# Patient Record
Sex: Male | Born: 1975 | Race: White | Hispanic: No | Smoking: Never smoker
Health system: Southern US, Community
[De-identification: ages and names within clinical notes are randomized; demographics above are authoritative.]

## PROBLEM LIST (undated history)

## (undated) DIAGNOSIS — J309 Allergic rhinitis, unspecified: Secondary | ICD-10-CM

## (undated) DIAGNOSIS — K219 Gastro-esophageal reflux disease without esophagitis: Secondary | ICD-10-CM

## (undated) DIAGNOSIS — I10 Essential (primary) hypertension: Secondary | ICD-10-CM

## (undated) HISTORY — DX: Gastro-esophageal reflux disease without esophagitis: K21.9

## (undated) HISTORY — DX: Allergic rhinitis, unspecified: J30.9

## (undated) HISTORY — DX: Essential (primary) hypertension: I10

---

## 2003-08-04 DIAGNOSIS — R002 Palpitations: Secondary | ICD-10-CM | POA: Insufficient documentation

## 2004-07-28 HISTORY — PX: TOE SURGERY: SHX1073

## 2004-12-04 DIAGNOSIS — R7402 Elevation of levels of lactic acid dehydrogenase (LDH): Secondary | ICD-10-CM | POA: Insufficient documentation

## 2005-09-30 DIAGNOSIS — F419 Anxiety disorder, unspecified: Secondary | ICD-10-CM | POA: Insufficient documentation

## 2009-07-15 ENCOUNTER — Ambulatory Visit (HOSPITAL_COMMUNITY): Admission: EM | Admit: 2009-07-15 | Discharge: 2009-07-15 | Payer: Self-pay | Admitting: Emergency Medicine

## 2010-08-03 IMAGING — CR DG TOE GREAT 2+V*R*
3 series · 3 of 3 positions shown · non-contrast
Comparison: This same date.

CLINICAL DATA: Dislocation at the IP joint.  Post reduction view.

RIGHT TOE - 2+ VIEW

[t toes ap right]
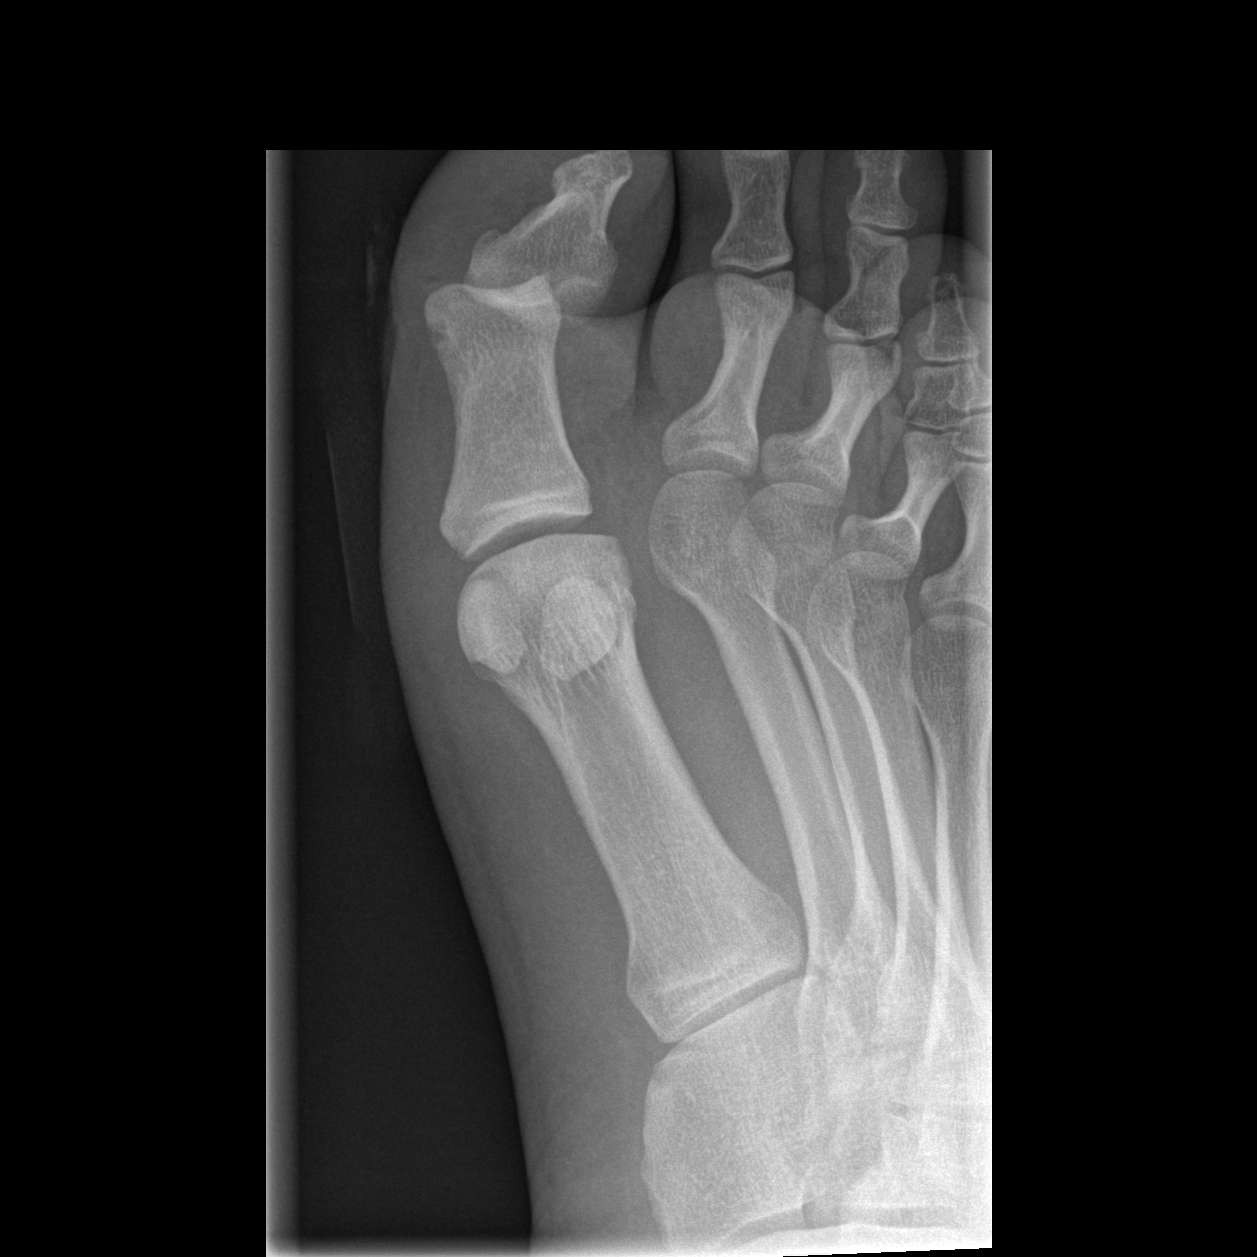

[t toes oblique right]
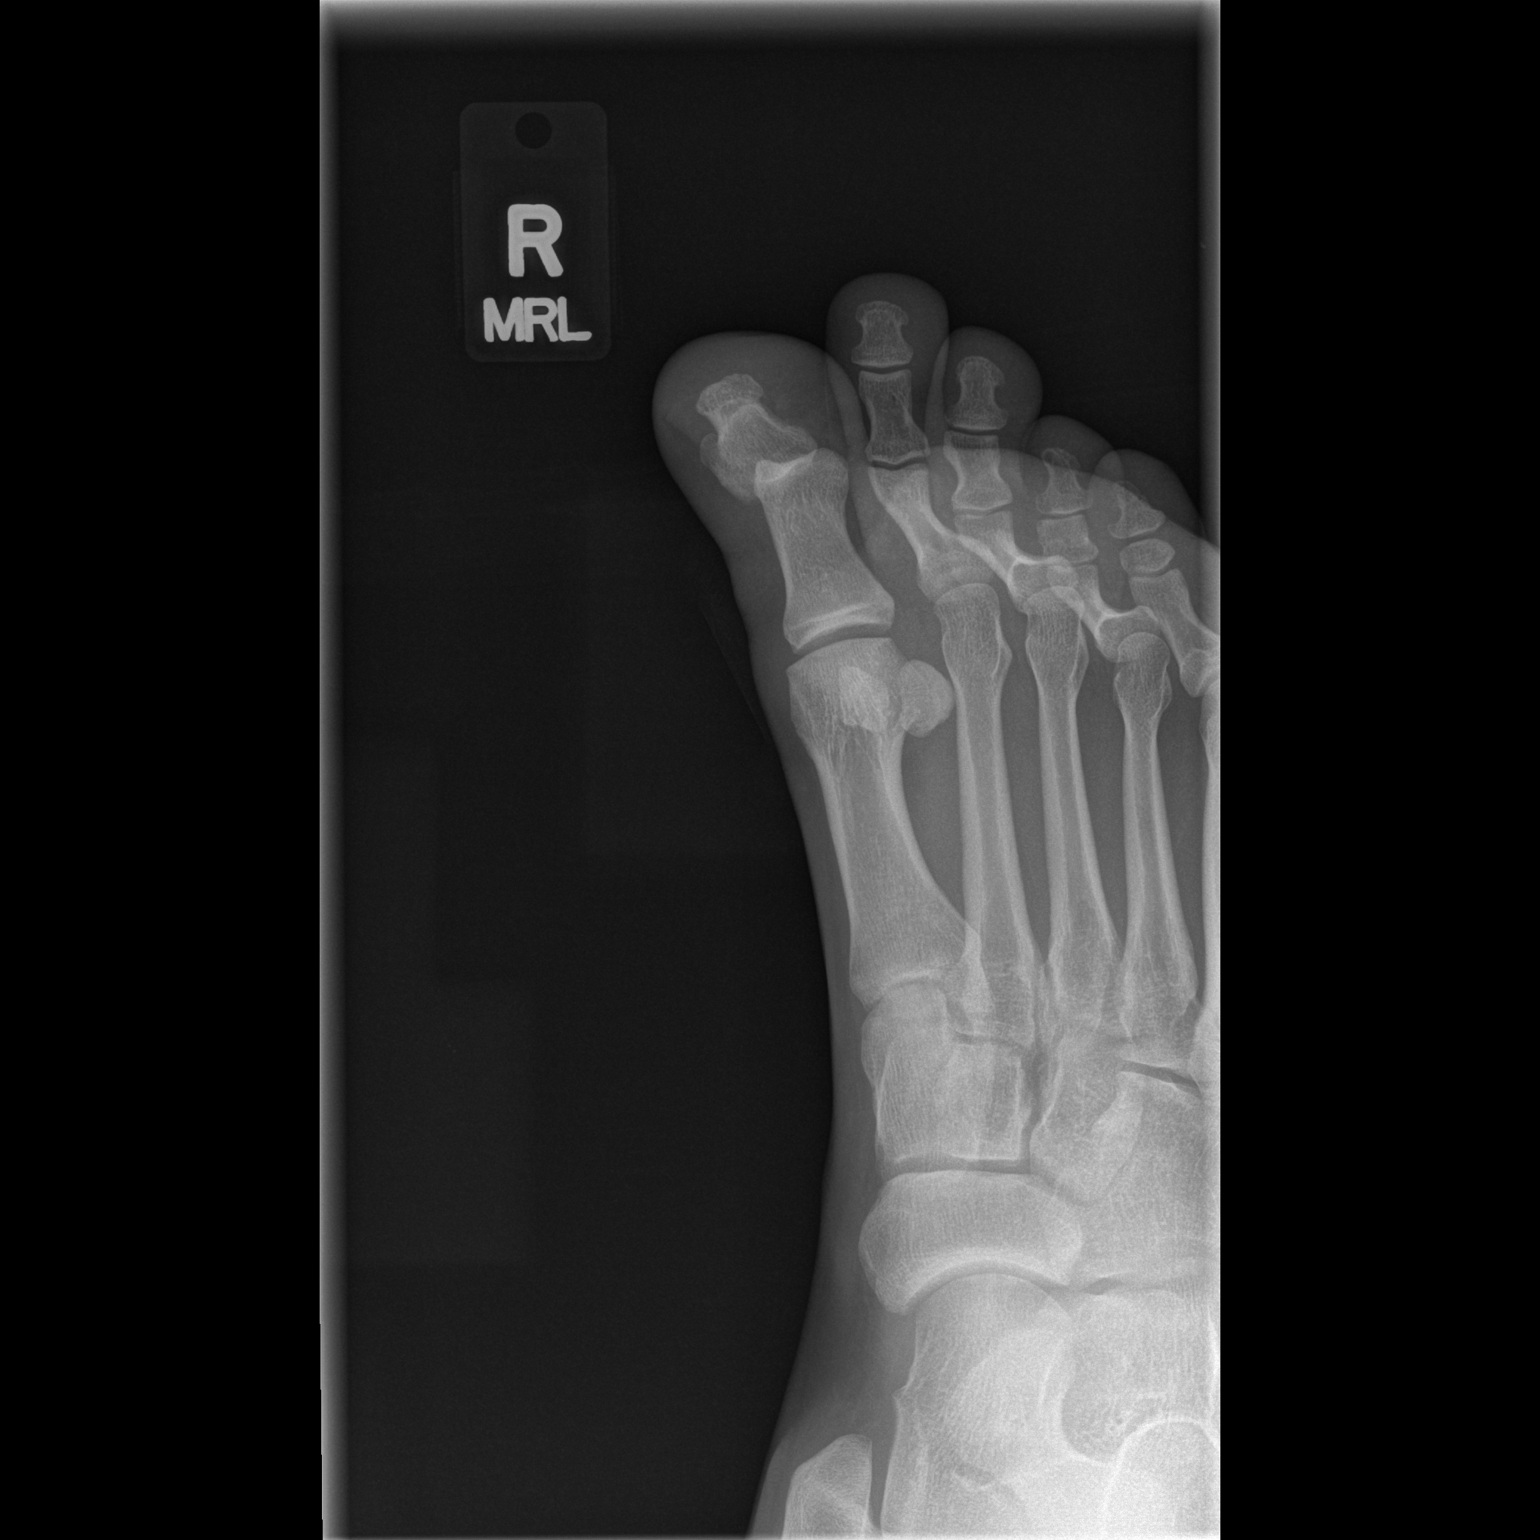

[t toes lateral right]
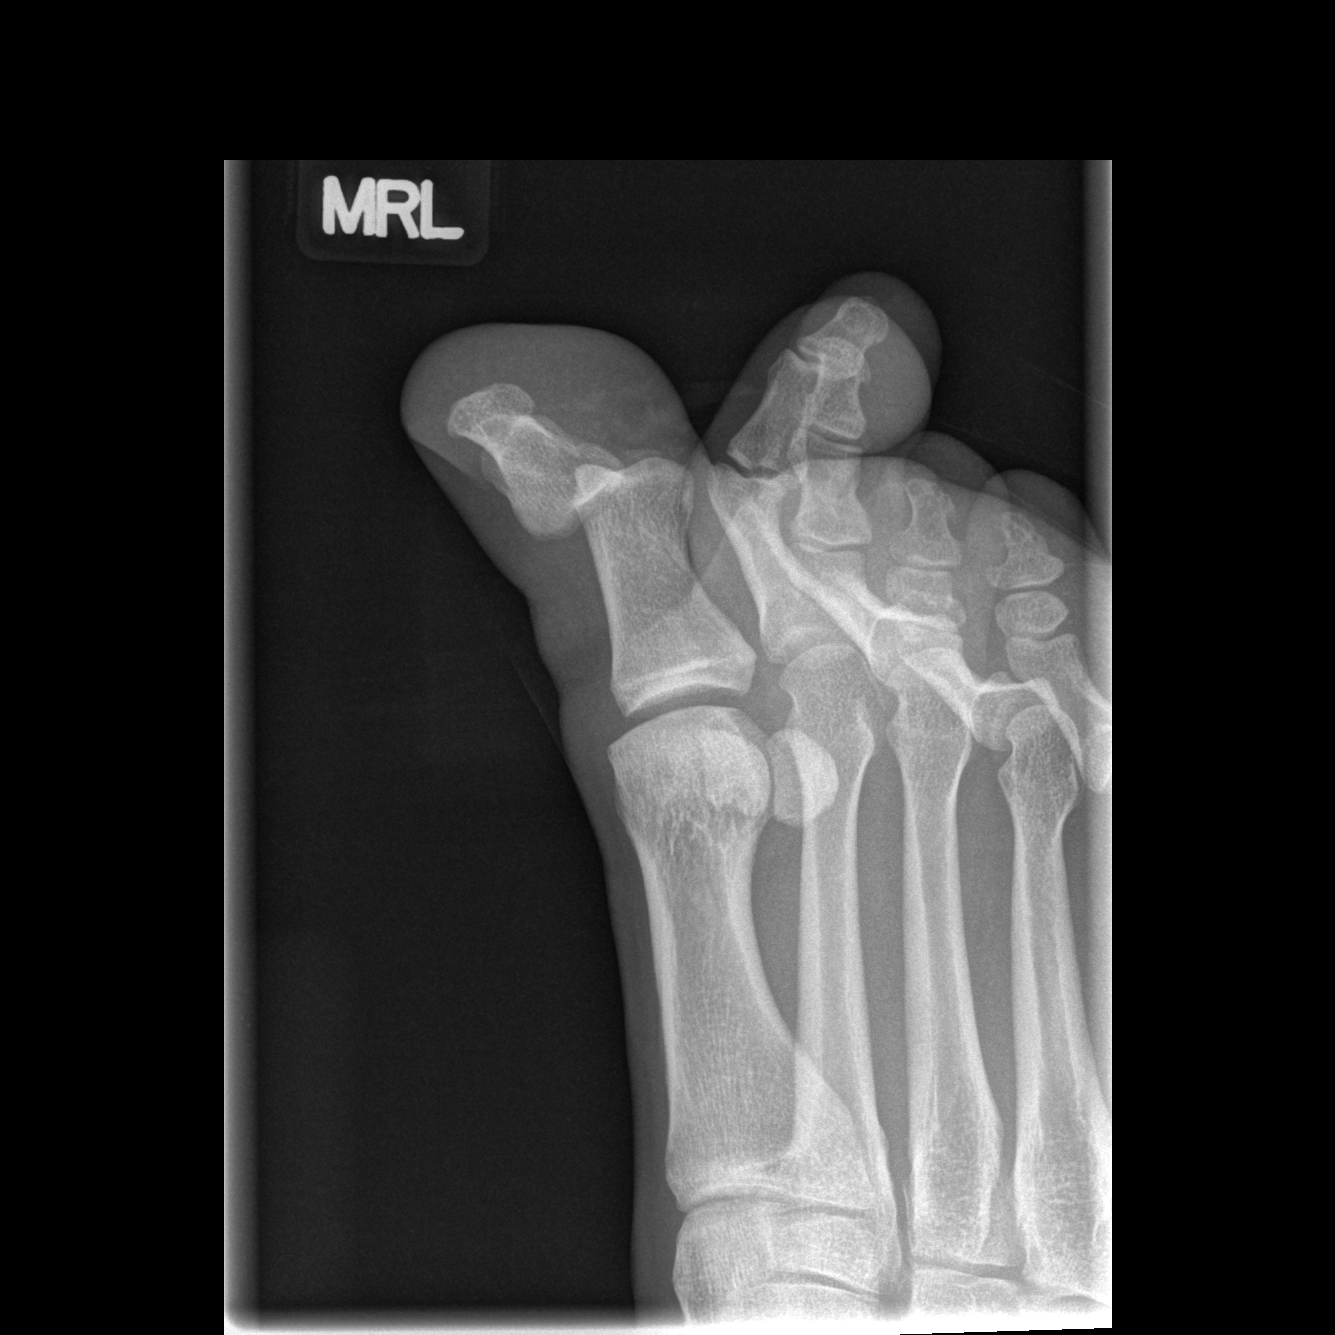

[3 of 3 positions shown; findings below may reference images not displayed]

FINDINGS: The dislocation has not been reduced.  On one view there
is suggestion of a possible  avulsed fragment of bone but this is
not definitive.
IMPRESSION: No change in the dislocation at the IP joint of great toe.
Possible avulsion fracture.

## 2017-11-06 ENCOUNTER — Ambulatory Visit: Payer: Self-pay | Admitting: Family Medicine

## 2017-11-20 ENCOUNTER — Ambulatory Visit: Payer: Self-pay | Admitting: Family Medicine

## 2017-12-10 LAB — HEPATIC FUNCTION PANEL
ALT: 20 (ref 10–40)
AST: 18 (ref 14–40)
Alkaline Phosphatase: 96 (ref 25–125)
BILIRUBIN, TOTAL: 0.3

## 2017-12-10 LAB — IRON,TIBC AND FERRITIN PANEL: Iron: 66

## 2017-12-10 LAB — BASIC METABOLIC PANEL
BUN: 16 (ref 4–21)
Creatinine: 0.9 (ref <=1.3)
GLUCOSE: 104
POTASSIUM: 5.2 (ref 3.4–5.3)
SODIUM: 140 (ref 137–147)

## 2017-12-10 LAB — LIPID PANEL
Cholesterol: 203 — AB (ref 0–200)
HDL: 44 (ref 35–70)
LDL Cholesterol: 117
Triglycerides: 211 — AB (ref 40–160)

## 2017-12-10 LAB — CBC AND DIFFERENTIAL
HEMATOCRIT: 48 (ref 41–53)
HEMOGLOBIN: 15.6 (ref 13.5–17.5)
Platelets: 345 (ref 150–399)
WBC: 7.6

## 2017-12-10 LAB — PSA: PSA: 0.7

## 2017-12-28 ENCOUNTER — Encounter: Payer: Self-pay | Admitting: Family Medicine

## 2017-12-28 ENCOUNTER — Ambulatory Visit (INDEPENDENT_AMBULATORY_CARE_PROVIDER_SITE_OTHER): Payer: PRIVATE HEALTH INSURANCE | Admitting: Family Medicine

## 2017-12-28 VITALS — BP 138/84 | HR 56 | Temp 98.3°F | Resp 16 | Ht 70.0 in | Wt 189.5 lb

## 2017-12-28 DIAGNOSIS — R7301 Impaired fasting glucose: Secondary | ICD-10-CM

## 2017-12-28 DIAGNOSIS — I10 Essential (primary) hypertension: Secondary | ICD-10-CM

## 2017-12-28 NOTE — Progress Notes (Signed)
Office Note 12/28/2017  CC:  Chief Complaint  Patient presents with  . Establish Care  . Follow-up    RCI, pt is fasting.     HPI:  Mark Montgomery is a 42 y.o. male who is here to establish care Patient's most recent primary MD: Dr. Truddie [facility]rum[facility]Stambaugh at [facility] in [place], [place]. Old records were reviewed prior to or during today's visit (BMET and FLP from 10/2017).  BMET/FLP from 10/2017 all normal except glucose 107-->the time of his most recent CPE.  HTN: home monitoring shows 130-140/60-65.   No recent changes in his med mgmt.  He recently had blood work through his employer but has no results yet. No known prior fasting glucose elevations, no known Hba1c testing. Exercise: active but no formal exercise.   Past Medical History:  Diagnosis Date  . Allergic rhinitis   . Hypertension     Past Surgical History:  Procedure Laterality Date  . TOE SURGERY Right 2006   Great Toe    Family History  Problem Relation Age of Onset  . Hearing loss Mother   . Heart disease Mother   . Hypertension Mother   . Heart attack Father   . Heart disease Sister   . Heart disease Paternal Grandmother   . Hypertension Paternal Grandmother     Social History   Socioeconomic History  . Marital status: Divorced    Spouse name: Not on file  . Number of children: Not on file  . Years of education: Not on file  . Highest education level: Not on file  Occupational History  . Not on file  Social Needs  . Financial resource strain: Not on file  . Food insecurity:    Worry: Not on file    Inability: Not on file  . Transportation needs:    Medical: Not on file    Non-medical: Not on file  Tobacco Use  . Smoking status: Passive Smoke Exposure - Never Smoker  . Smokeless tobacco: Never Used  Substance and Sexual Activity  . Alcohol use: Yes    Comment: occasionally  . Drug use: Never  . Sexual activity: Not on file  Lifestyle  . Physical activity:    Days per week: Not on  file    Minutes per session: Not on file  . Stress: Not on file  Relationships  . Social connections:    Talks on phone: Not on file    Gets together: Not on file    Attends religious service: Not on file    Active member of club or organization: Not on file    Attends meetings of clubs or organizations: Not on file    Relationship status: Not on file  . Intimate partner violence:    Fear of current or ex partner: Not on file    Emotionally abused: Not on file    Physically abused: Not on file    Forced sexual activity: Not on file  Other Topics Concern  . Not on file  Social History Narrative   Divorced,no children.   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   No tobacco.   Alc: occ/social.    Outpatient Encounter Medications as of 12/28/2017  Medication Sig  . atenolol (TENORMIN) 100 MG tablet Take 1 tablet by mouth daily.  . fluticasone (FLONASE) 50 MCG/ACT nasal spray Place 1 spray into both nostrils daily as needed.  Marland Kitchen. ibuprofen (ADVIL,MOTRIN) 400 MG tablet Take 1 tablet by mouth  2 (two) times daily as needed.  . loratadine (CLARITIN) 10 MG tablet Take 1 tablet by mouth daily as needed.  . montelukast (SINGULAIR) 10 MG tablet Take 1 tablet by mouth daily as needed.   No facility-administered encounter medications on file as of 12/28/2017.     No Known Allergies  ROS Review of Systems  Constitutional: Negative for fatigue and fever.  HENT: Negative for congestion and sore throat.   Eyes: Negative for visual disturbance.  Respiratory: Negative for cough.   Cardiovascular: Negative for chest pain.  Gastrointestinal: Negative for abdominal pain and nausea.  Genitourinary: Negative for dysuria.  Musculoskeletal: Positive for arthralgias (low back and knees achy alot--physical labor). Negative for back pain and joint swelling.  Skin: Negative for rash.  Neurological: Negative for weakness and headaches.  Hematological: Negative  for adenopathy.    PE; Initial bp today 151/76.  Manual bp recheck: 138/84 Blood pressure 138/84, pulse (!) 56, temperature 98.3 F (36.8 C), temperature source Oral, resp. rate 16, height 5\' 10"  (1.778 m), weight 189 lb 8 oz (86 kg), SpO2 100 %. Body mass index is 27.19 kg/m.  Gen: Alert, well appearing.  Patient is oriented to person, place, time, and situation. AFFECT: pleasant, lucid thought and speech. GNF:AOZH: no injection, icteris, swelling, or exudate.  EOMI, PERRLA. Mouth: lips without lesion/swelling.  Oral mucosa pink and moist. Oropharynx without erythema, exudate, or swelling.  CV: RRR, no m/r/g.   LUNGS: CTA bilat, nonlabored resps, good aeration in all lung fields. ABD: soft, NT EXT: no clubbing, cyanosis, or edema.   Pertinent labs:  None today (see HPI above)  ASSESSMENT AND PLAN:   New pt: obtain old PCP records.  1) HTN: Borderline home measurements as well as here today (systolics). He is not in favor of any med change/addition today, wants to focus on better diet and exercise. He is open to change in med mgmt if this is not effective.  2) IFG: very mild gluc elevation last labs 10/2017. He will drop off results from recent labs drawn via his employer when he gets his results. Plan on A1c check with his [facility] labs at next CPE--due 10/2018.  An After Visit Summary was printed and given to the patient.  Return for CPE 10/2018.  Signed:  Santiago Bumpers, MD           12/28/2017

## 2018-01-21 ENCOUNTER — Encounter: Payer: Self-pay | Admitting: Family Medicine

## 2018-02-15 ENCOUNTER — Encounter

## 2018-02-15 ENCOUNTER — Ambulatory Visit (INDEPENDENT_AMBULATORY_CARE_PROVIDER_SITE_OTHER): Payer: PRIVATE HEALTH INSURANCE | Admitting: Family Medicine

## 2018-02-15 ENCOUNTER — Encounter: Payer: Self-pay | Admitting: Family Medicine

## 2018-02-15 VITALS — BP 167/72 | HR 47 | Temp 98.3°F | Resp 16 | Ht 70.0 in | Wt 189.1 lb

## 2018-02-15 DIAGNOSIS — J029 Acute pharyngitis, unspecified: Secondary | ICD-10-CM | POA: Diagnosis not present

## 2018-02-15 DIAGNOSIS — K219 Gastro-esophageal reflux disease without esophagitis: Secondary | ICD-10-CM

## 2018-02-15 MED ORDER — PANTOPRAZOLE SODIUM 40 MG PO TBEC: 40.0000 mg | DELAYED_RELEASE_TABLET | Freq: Every day | ORAL | 3 refills | Status: DC

## 2018-02-15 NOTE — Progress Notes (Signed)
OFFICE VISIT  02/15/2018   CC:  Chief Complaint  Patient presents with  . Sore Throat    off and on x 2-3 weeks    HPI:    Patient is a 42 y.o.  male who presents for sore throat. Two to 3 weeks of on/off feeling of sore/dry throat, mainly in mornings.  Later in the daily is variable. No PND, no fevers, no URI sx's.  Uses saline solution usually but lately it is not doing much. Has claritin and singulair inconsistently but flonase more regularly latel. No malaise.  Otherwise feeling normal.  Denies feeling of GER but says he did feels some this morning.  No reflux meds taken.  Past Medical History:  Diagnosis Date  . Allergic rhinitis   . Hypertension     Past Surgical History:  Procedure Laterality Date  . TOE SURGERY Right 2006   Great Toe    Outpatient Medications Prior to Visit  Medication Sig Dispense Refill  . atenolol (TENORMIN) 100 MG tablet Take 1 tablet by mouth daily.    . fluticasone (FLONASE) 50 MCG/ACT nasal spray Place 1 spray into both nostrils daily as needed.    Marland Kitchen. ibuprofen (ADVIL,MOTRIN) 400 MG tablet Take 1 tablet by mouth 2 (two) times daily as needed.    . loratadine (CLARITIN) 10 MG tablet Take 1 tablet by mouth daily as needed.    . montelukast (SINGULAIR) 10 MG tablet Take 1 tablet by mouth daily as needed.     No facility-administered medications prior to visit.     No Known Allergies  ROS As per HPI  PE: Blood pressure (!) 167/72, pulse (!) 47, temperature 98.3 F (36.8 C), temperature source Oral, resp. rate 16, height 5\' 10"  (1.778 m), weight 189 lb 2 oz (85.8 kg), SpO2 99 %. Gen: Alert, well appearing.  Patient is oriented to person, place, time, and situation. AFFECT: pleasant, lucid thought and speech. ENT: Ears: EACs clear, normal epithelium.  TMs with good light reflex and landmarks bilaterally.  Eyes: no injection, icteris, swelling, or exudate.  EOMI, PERRLA. Nose: no drainage or turbinate edema/swelling.  No injection or focal  lesion.  Mouth: lips without lesion/swelling.  Oral mucosa pink and moist.  Dentition intact and without obvious caries or gingival swelling.  Oropharynx without significant erythema.  No exudate or swelling.  Neck - No masses or thyromegaly or limitation in range of motion CV: RRR, no m/r/g.   LUNGS: CTA bilat, nonlabored resps, good aeration in all lung fields.   LABS:    Chemistry      Component Value Date/Time   NA 140 12/10/2017   K 5.2 12/10/2017   BUN 16 12/10/2017   CREATININE 0.9 12/10/2017   GLU 104 12/10/2017      Component Value Date/Time   ALKPHOS 96 12/10/2017   AST 18 12/10/2017   ALT 20 12/10/2017       IMPRESSION AND PLAN:  1) LPR: suspect he is having silent reflux giving his ST. Discussed dx: pantoprazole 40mg  qAM. Dietary and behav mod discussed, handout given.  An After Visit Summary was printed and given to the patient.  FOLLOW UP: Return in about 1 month (around 03/15/2018) for f/u LPR/ST.  Signed:  Santiago BumpersPhil Latha Staunton, MD           02/15/2018

## 2018-02-15 NOTE — Patient Instructions (Signed)
Gastroesophageal Reflux Disease, Adult Normally, food travels down the esophagus and stays in the stomach to be digested. However, when a person has gastroesophageal reflux disease (GERD), food and stomach acid move back up into the esophagus. When this happens, the esophagus becomes sore and inflamed. Over time, GERD can create small holes (ulcers) in the lining of the esophagus. What are the causes? This condition is caused by a problem with the muscle between the esophagus and the stomach (lower esophageal sphincter, or LES). Normally, the LES muscle closes after food passes through the esophagus to the stomach. When the LES is weakened or abnormal, it does not close properly, and that allows food and stomach acid to go back up into the esophagus. The LES can be weakened by certain dietary substances, medicines, and medical conditions, including:  Tobacco use.  Pregnancy.  Having a hiatal hernia.  Heavy alcohol use.  Certain foods and beverages, such as coffee, chocolate, onions, and peppermint.  What increases the risk? This condition is more likely to develop in:  People who have an increased body weight.  People who have connective tissue disorders.  People who use NSAID medicines.  What are the signs or symptoms? Symptoms of this condition include:  Heartburn.  Difficult or painful swallowing.  The feeling of having a lump in the throat.  Abitter taste in the mouth.  Bad breath.  Having a large amount of saliva.  Having an upset or bloated stomach.  Belching.  Chest pain.  Shortness of breath or wheezing.  Ongoing (chronic) cough or a night-time cough.  Wearing away of tooth enamel.  Weight loss.  Different conditions can cause chest pain. Make sure to see your health care provider if you experience chest pain. How is this diagnosed? Your health care provider will take a medical history and perform a physical exam. To determine if you have mild or severe  GERD, your health care provider may also monitor how you respond to treatment. You may also have other tests, including:  An endoscopy toexamine your stomach and esophagus with a small camera.  A test thatmeasures the acidity level in your esophagus.  A test thatmeasures how much pressure is on your esophagus.  A barium swallow or modified barium swallow to show the shape, size, and functioning of your esophagus.  How is this treated? The goal of treatment is to help relieve your symptoms and to prevent complications. Treatment for this condition may vary depending on how severe your symptoms are. Your health care provider may recommend:  Changes to your diet.  Medicine.  Surgery.  Follow these instructions at home: Diet  Follow a diet as recommended by your health care provider. This may involve avoiding foods and drinks such as: ? Coffee and tea (with or without caffeine). ? Drinks that containalcohol. ? Energy drinks and sports drinks. ? Carbonated drinks or sodas. ? Chocolate and cocoa. ? Peppermint and mint flavorings. ? Garlic and onions. ? Horseradish. ? Spicy and acidic foods, including peppers, chili powder, curry powder, vinegar, hot sauces, and barbecue sauce. ? Citrus fruit juices and citrus fruits, such as oranges, lemons, and limes. ? Tomato-based foods, such as red sauce, chili, salsa, and pizza with red sauce. ? Fried and fatty foods, such as donuts, french fries, potato chips, and high-fat dressings. ? High-fat meats, such as hot dogs and fatty cuts of red and white meats, such as rib eye steak, sausage, ham, and bacon. ? High-fat dairy items, such as whole milk,   butter, and cream cheese.  Eat small, frequent meals instead of large meals.  Avoid drinking large amounts of liquid with your meals.  Avoid eating meals during the 2-3 hours before bedtime.  Avoid lying down right after you eat.  Do not exercise right after you eat. General  instructions  Pay attention to any changes in your symptoms.  Take over-the-counter and prescription medicines only as told by your health care provider. Do not take aspirin, ibuprofen, or other NSAIDs unless your health care provider told you to do so.  Do not use any tobacco products, including cigarettes, chewing tobacco, and e-cigarettes. If you need help quitting, ask your health care provider.  Wear loose-fitting clothing. Do not wear anything tight around your waist that causes pressure on your abdomen.  Raise (elevate) the head of your bed 6 inches (15cm).  Try to reduce your stress, such as with yoga or meditation. If you need help reducing stress, ask your health care provider.  If you are overweight, reduce your weight to an amount that is healthy for you. Ask your health care provider for guidance about a safe weight loss goal.  Keep all follow-up visits as told by your health care provider. This is important. Contact a health care provider if:  You have new symptoms.  You have unexplained weight loss.  You have difficulty swallowing, or it hurts to swallow.  You have wheezing or a persistent cough.  Your symptoms do not improve with treatment.  You have a hoarse voice. Get help right away if:  You have pain in your arms, neck, jaw, teeth, or back.  You feel sweaty, dizzy, or light-headed.  You have chest pain or shortness of breath.  You vomit and your vomit looks like blood or coffee grounds.  You faint.  Your stool is bloody or black.  You cannot swallow, drink, or eat. This information is not intended to replace advice given to you by your health care provider. Make sure you discuss any questions you have with your health care provider. Document Released: 04/23/2005 Document Revised: 12/12/2015 Document Reviewed: 11/08/2014 Elsevier Interactive Patient Education  2018 [employer] Inc.  

## 2018-03-18 ENCOUNTER — Ambulatory Visit: Payer: PRIVATE HEALTH INSURANCE | Admitting: Family Medicine

## 2018-03-18 ENCOUNTER — Encounter: Payer: Self-pay | Admitting: Family Medicine

## 2018-03-18 VITALS — BP 147/73 | HR 53 | Temp 98.2°F | Resp 16 | Ht 70.0 in | Wt 187.5 lb

## 2018-03-18 DIAGNOSIS — K219 Gastro-esophageal reflux disease without esophagitis: Secondary | ICD-10-CM

## 2018-03-18 DIAGNOSIS — I10 Essential (primary) hypertension: Secondary | ICD-10-CM | POA: Diagnosis not present

## 2018-03-18 NOTE — Progress Notes (Signed)
OFFICE VISIT  03/18/2018   CC:  Chief Complaint  Patient presents with  . Follow-up    LPR/ST     HPI:    Patient is a 42 y.o.  male who presents for 1 mo f/u sore throat that I felt was coming from LPR/silent GER. I rx'd pantoprazole 40mg  qd at that time.  Also had him focus more on consistently taking his allergic rhinitis meds in case some PND was contributing.  Interim HX:  Improved significantly.  No side effects of med. No cough, no dysphagia.  HTN: avg <130/80.  No HAs, no dizziness, no SOB, no palpiations.   Past Medical History:  Diagnosis Date  . Allergic rhinitis   . Hypertension     Past Surgical History:  Procedure Laterality Date  . TOE SURGERY Right 2006   Great Toe    Outpatient Medications Prior to Visit  Medication Sig Dispense Refill  . atenolol (TENORMIN) 100 MG tablet Take 1 tablet by mouth daily.    . fluticasone (FLONASE) 50 MCG/ACT nasal spray Place 1 spray into both nostrils daily as needed.    Marland Kitchen. ibuprofen (ADVIL,MOTRIN) 400 MG tablet Take 1 tablet by mouth 2 (two) times daily as needed.    . loratadine (CLARITIN) 10 MG tablet Take 1 tablet by mouth daily as needed.    . montelukast (SINGULAIR) 10 MG tablet Take 1 tablet by mouth daily as needed.    . pantoprazole (PROTONIX) 40 MG tablet Take 1 tablet (40 mg total) by mouth daily. 30 tablet 3   No facility-administered medications prior to visit.     No Known Allergies  ROS As per HPI  PE: Blood pressure (!) 147/73, pulse (!) 53, temperature 98.2 F (36.8 C), temperature source Oral, resp. rate 16, height 5\' 10"  (1.778 m), weight 187 lb 8 oz (85 kg), SpO2 100 %. Gen: Alert, well appearing.  Patient is oriented to person, place, time, and situation. AFFECT: pleasant, lucid thought and speech. ZOX:WRUEENT:Eyes: no injection, icteris, swelling, or exudate.  EOMI, PERRLA. Mouth: lips without lesion/swelling.  Oral mucosa pink and moist. Oropharynx without erythema, exudate, or swelling.  CV:  RRR, no m/r/g.   LUNGS: CTA bilat, nonlabored resps, good aeration in all lung fields. EXT: no clubbing or cyanosis.  No edema.    LABS:    Chemistry      Component Value Date/Time   NA 140 12/10/2017   K 5.2 12/10/2017   BUN 16 12/10/2017   CREATININE 0.9 12/10/2017   GLU 104 12/10/2017      Component Value Date/Time   ALKPHOS 96 12/10/2017   AST 18 12/10/2017   ALT 20 12/10/2017       IMPRESSION AND PLAN:  1) LPR: improved, continue daily PPI for the next 8 mo and then we'll discuss ween off this med to switch to prn use of H2 blocker.  2) HTN: The current medical regimen is effective;  continue present plan and medications. Lytes/cr good 3 mo ago.  An After Visit Summary was printed and given to the patient.  FOLLOW UP: Return in about 8 months (around 11/17/2018) for annual CPE (fasting).   Signed:  Santiago BumpersPhil McGowen, MD           03/18/2018

## 2018-05-18 ENCOUNTER — Other Ambulatory Visit: Payer: Self-pay

## 2018-05-18 MED ORDER — PANTOPRAZOLE SODIUM 40 MG PO TBEC: 40.0000 mg | DELAYED_RELEASE_TABLET | Freq: Every day | ORAL | 3 refills | Status: DC

## 2018-07-09 ENCOUNTER — Ambulatory Visit: Payer: PRIVATE HEALTH INSURANCE | Admitting: Family Medicine

## 2018-07-09 ENCOUNTER — Encounter: Payer: Self-pay | Admitting: Family Medicine

## 2018-07-09 VITALS — BP 148/76 | HR 53 | Temp 98.2°F | Resp 16 | Ht 70.0 in | Wt 190.2 lb

## 2018-07-09 DIAGNOSIS — K1379 Other lesions of oral mucosa: Secondary | ICD-10-CM

## 2018-07-09 DIAGNOSIS — R070 Pain in throat: Secondary | ICD-10-CM | POA: Diagnosis not present

## 2018-07-09 DIAGNOSIS — K130 Diseases of lips: Secondary | ICD-10-CM

## 2018-07-09 DIAGNOSIS — G8929 Other chronic pain: Secondary | ICD-10-CM | POA: Diagnosis not present

## 2018-07-09 NOTE — Progress Notes (Signed)
OFFICE VISIT  07/09/2018   CC:  Chief Complaint  Patient presents with  . Bump on lip  . Sore Throat   HPI:    Patient is a 42 y.o.  male who presents for "bump on lip" and sore throat. Inside of lower lip with a painless bump noted around 2-3 wks ago.  Initially looked like a blister but now it is smaller.  No drainage that he can tell.  Still with mild ST on /off, lately has noted it for the last 1 week constant.  The intensity is 3/10 typically, sometimes on R, sometimes on L, sometimes all over throat.  No hoarseness, no globus sensation, no cough or URI sx's. Still taking pantoprazole daily.  He felt improvement from this initially but when cold weather came on the ST came back. No variation with what he eats and no worse supine compared to upright position.  He does not smoke.   Past Medical History:  Diagnosis Date  . Allergic rhinitis   . Hypertension     Past Surgical History:  Procedure Laterality Date  . TOE SURGERY Right 2006   Great Toe    Outpatient Medications Prior to Visit  Medication Sig Dispense Refill  . atenolol (TENORMIN) 100 MG tablet Take 1 tablet by mouth daily.    . fluticasone (FLONASE) 50 MCG/ACT nasal spray Place 1 spray into both nostrils daily as needed.    Marland Kitchen. ibuprofen (ADVIL,MOTRIN) 400 MG tablet Take 1 tablet by mouth 2 (two) times daily as needed.    . loratadine (CLARITIN) 10 MG tablet Take 1 tablet by mouth daily as needed.    . montelukast (SINGULAIR) 10 MG tablet Take 1 tablet by mouth daily as needed.    . pantoprazole (PROTONIX) 40 MG tablet Take 1 tablet (40 mg total) by mouth daily. 30 tablet 3   No facility-administered medications prior to visit.     No Known Allergies  ROS As per HPI  PE: Blood pressure (!) 148/76, pulse (!) 53, temperature 98.2 F (36.8 C), temperature source Oral, resp. rate 16, height 5\' 10"  (1.778 m), weight 190 lb 4 oz (86.3 kg), SpO2 100 %. Gen: Alert, well appearing.  Patient is oriented to  person, place, time, and situation. AFFECT: pleasant, lucid thought and speech. Inside of lower lip, right of midline, there is a 2 mm submucosal nodular lesion that is fairly rubbery.  No fluctuance, no erythema, no tenderness. Remainder of mouth/gingiva/buccal mucosa/lips all normal.  Palate and pharynx normal.  LABS:    Chemistry      Component Value Date/Time   NA 140 12/10/2017   K 5.2 12/10/2017   BUN 16 12/10/2017   CREATININE 0.9 12/10/2017   GLU 104 12/10/2017      Component Value Date/Time   ALKPHOS 96 12/10/2017   AST 18 12/10/2017   ALT 20 12/10/2017      IMPRESSION AND PLAN:  1) Lower lip mucocele: reassured patient. No treatment necessary.  2) Chronic throat pain: initially thought this was from LPR but after some initial improvement with pantoprazole qd, the pain has returned and persisted.  Pt is concerned that a diagnosis is being missed, is in favor of referral to specialist so I referred him to [facility] today.  An After Visit Summary was printed and given to the patient.  FOLLOW UP: Return for as needed.  Signed:  Santiago BumpersPhil McGowen, MD           07/09/2018

## 2018-07-13 ENCOUNTER — Encounter: Payer: Self-pay | Admitting: Family Medicine

## 2018-07-28 DIAGNOSIS — K219 Gastro-esophageal reflux disease without esophagitis: Secondary | ICD-10-CM

## 2018-07-28 HISTORY — DX: Gastro-esophageal reflux disease without esophagitis: K21.9

## 2018-08-06 ENCOUNTER — Encounter: Payer: Self-pay | Admitting: Gastroenterology

## 2018-08-16 ENCOUNTER — Other Ambulatory Visit: Payer: Self-pay | Admitting: Family Medicine

## 2018-08-19 NOTE — Progress Notes (Signed)
[facility] Consult Note:  History: Mark Montgomery 08/20/2018  Referring physician: Jeoffrey Massed, MD  Reason for consult/chief complaint: chronic sore throat (on and off almost a year, no bleeding)   Subjective  HPI:  This is a very pleasant 43 year old man referred by primary care for nearly a year of intermittent throat pain.  It is usually midline, sometimes more toward one side or the other.  It occurs more often in cold weather, and may hurt when he takes a deep breath in that situation.  He denies dysphagia, odynophagia, hematemesis or hemoptysis.  He has chronic allergic issues has not previously seen allergist or ENT. He was seen by primary care last July for sore throat with allergic symptoms as well.  Presumptive diagnosis was LPR and he was given PPI.  No provement in symptoms with that.  He denies nausea, vomiting, anorexia or weight loss.  Bowel habits regular without rectal bleeding.  ROS:  Review of Systems  He denies chest pain dyspnea or dysuria  Past Medical History: Past Medical History:  Diagnosis Date  . Allergic rhinitis   . Hypertension      Past Surgical History: Past Surgical History:  Procedure Laterality Date  . TOE SURGERY Right 2006   Great Toe     Family History: Family History  Problem Relation Age of Onset  . Hearing loss Mother   . Heart disease Mother   . Hypertension Mother   . Heart attack Father   . Heart disease Sister   . Heart disease Paternal Grandmother   . Hypertension Paternal Grandmother   . Colon cancer Neg Hx   . Esophageal cancer Neg Hx   . Rectal cancer Neg Hx    No known family history of gastric, esophageal or laryngeal cancer.  Social History: Social History   Socioeconomic History  . Marital status: Divorced    Spouse name: Not on file  . Number of children: 0  . Years of education: Not on file  . Highest education level: Not on file  Occupational History  . Occupation:  Psychologist, occupational  Social Needs  . Financial resource strain: Not on file  . Food insecurity:    Worry: Not on file    Inability: Not on file  . Transportation needs:    Medical: Not on file    Non-medical: Not on file  Tobacco Use  . Smoking status: Passive Smoke Exposure - Never Smoker  . Smokeless tobacco: Never Used  Substance and Sexual Activity  . Alcohol use: Yes    Comment: occasionally  . Drug use: Never  . Sexual activity: Not on file  Lifestyle  . Physical activity:    Days per week: Not on file    Minutes per session: Not on file  . Stress: Not on file  Relationships  . Social connections:    Talks on phone: Not on file    Gets together: Not on file    Attends religious service: Not on file    Active member of club or organization: Not on file    Attends meetings of clubs or organizations: Not on file    Relationship status: Not on file  Other Topics Concern  . Not on file  Social History Narrative   Divorced,no children.   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   No tobacco.   Alc: occ/social.   Previously used chewing tobacco   allergies: No Known Allergies  Outpatient Meds: Current Outpatient Medications  Medication Sig Dispense Refill  . atenolol (TENORMIN) 100 MG tablet Take 1 tablet by mouth daily.    . fluticasone (FLONASE) 50 MCG/ACT nasal spray Place 1 spray into both nostrils daily as needed.    Marland Kitchen ibuprofen (ADVIL,MOTRIN) 400 MG tablet Take 1 tablet by mouth 2 (two) times daily as needed.    . loratadine (CLARITIN) 10 MG tablet Take 1 tablet by mouth daily as needed.     No current facility-administered medications for this visit.       ___________________________________________________________________ Objective   Exam:  BP 120/60   Pulse 76   Ht 5\' 8"  (1.727 m)   Wt 185 lb (83.9 kg)   BMI 28.13 kg/m    General: this is a(n) well-appearing man with normal vocal quality  Eyes:  sclera anicteric, no redness  ENT: oral mucosa moist without lesions, no cervical or supraclavicular lymphadenopathy  CV: RRR without murmur, S1/S2, no JVD, no peripheral edema  Resp: clear to auscultation bilaterally, normal RR and effort noted  GI: soft, no tenderness, with active bowel sounds. No guarding or palpable organomegaly noted.  Skin; warm and dry, no rash   Labs:  CBC Latest Ref Rng & Units 12/10/2017  WBC - 7.6  Hemoglobin 13.5 - 17.5 15.6  Hematocrit 41 - 53 48  Platelets 150 - 399 345   No imaging  Assessment: Encounter Diagnosis  Name Primary?  . Throat pain Yes    I do not think this is LPR.  It sounds upper airway/allergic/environmental trigger.  Plan:  Referral to ENT.  Thank you for the courtesy of this consult.  Please call me with any questions or concerns.  Mark Montgomery  CC: Referring provider noted above

## 2018-08-20 ENCOUNTER — Ambulatory Visit: Payer: PRIVATE HEALTH INSURANCE | Admitting: Gastroenterology

## 2018-08-20 ENCOUNTER — Encounter: Payer: Self-pay | Admitting: Gastroenterology

## 2018-08-20 VITALS — BP 120/60 | HR 76 | Ht 68.0 in | Wt 185.0 lb

## 2018-08-20 DIAGNOSIS — R070 Pain in throat: Secondary | ICD-10-CM

## 2018-08-20 NOTE — Patient Instructions (Signed)
If you are age 41 or older, your body mass index should be between 23-30. Your Body mass index is 28.13 kg/m. If this is out of the aforementioned range listed, please consider follow up with your Primary Care Provider.  If you are age 41 or younger, your body mass index should be between 19-25. Your Body mass index is 28.13 kg/m. If this is out of the aformentioned range listed, please consider follow up with your Primary Care Provider.   [facility]  It was a pleasure to see you today!  Dr. Myrtie Neitheranis

## 2018-08-23 ENCOUNTER — Telehealth: Payer: Self-pay

## 2018-08-23 NOTE — Telephone Encounter (Signed)
Per Dr Myrtie Neither' request. Referral sent to Dr Kar[facility], for throat pain. Will await appointment information.

## 2018-08-27 NOTE — Telephone Encounter (Signed)
Called to check on the status. Pt is not yet scheduled.

## 2018-09-07 NOTE — Telephone Encounter (Signed)
Called and checked on the referral. Pt is still not scheduled. Spoke to [place]. Referral has been re-faxed to her attention as she requested.

## 2018-09-23 NOTE — Telephone Encounter (Signed)
Pt is scheduled 09-27-2018 @ 330 pm with Dr Jenne Pane.

## 2018-09-27 HISTORY — PX: OTHER SURGICAL HISTORY: SHX169

## 2018-10-10 ENCOUNTER — Encounter: Payer: Self-pay | Admitting: Family Medicine

## 2018-10-23 ENCOUNTER — Other Ambulatory Visit: Payer: Self-pay | Admitting: Family Medicine

## 2018-10-23 MED ORDER — ATENOLOL 100 MG PO TABS: 100.0000 mg | ORAL_TABLET | Freq: Every day | ORAL | 3 refills | Status: DC

## 2018-10-28 ENCOUNTER — Encounter: Payer: PRIVATE HEALTH INSURANCE | Admitting: Family Medicine

## 2018-12-01 ENCOUNTER — Encounter: Payer: PRIVATE HEALTH INSURANCE | Admitting: Family Medicine

## 2018-12-27 ENCOUNTER — Encounter: Payer: PRIVATE HEALTH INSURANCE | Admitting: Family Medicine

## 2019-03-16 LAB — CBC AND DIFFERENTIAL
HCT: 48 (ref 41–53)
Hemoglobin: 15.7 (ref 13.5–17.5)
Platelets: 337 (ref 150–399)
WBC: 8.1

## 2019-03-16 LAB — LIPID PANEL
Cholesterol: 194 (ref 0–200)
HDL: 58 (ref 35–70)
LDL Cholesterol: 121
LDl/HDL Ratio: 3
Triglycerides: 74 (ref 40–160)

## 2019-03-16 LAB — HEPATIC FUNCTION PANEL
ALT: 23 (ref 10–40)
AST: 19 (ref 14–40)
Alkaline Phosphatase: 87 (ref 25–125)
Bilirubin, Total: 0.5

## 2019-03-16 LAB — IRON,TIBC AND FERRITIN PANEL: Iron: 77.06

## 2019-03-16 LAB — BASIC METABOLIC PANEL
BUN: 13 (ref 4–21)
Creatinine: 0.9 (ref 0.6–1.3)
Glucose: 113
Potassium: 4.9 (ref 3.4–5.3)
Sodium: 139 (ref 137–147)

## 2019-03-16 LAB — CBC: RBC: 5.4 — AB (ref 3.87–5.11)

## 2019-03-18 LAB — PSA: PSA: 0.7

## 2019-03-30 ENCOUNTER — Encounter: Payer: PRIVATE HEALTH INSURANCE | Admitting: Family Medicine

## 2019-05-05 ENCOUNTER — Other Ambulatory Visit: Payer: Self-pay

## 2019-05-05 ENCOUNTER — Encounter: Payer: PRIVATE HEALTH INSURANCE | Admitting: Family Medicine

## 2019-05-05 DIAGNOSIS — Z20822 Contact with and (suspected) exposure to covid-19: Secondary | ICD-10-CM

## 2019-05-05 NOTE — Progress Notes (Deleted)
Office Note 05/05/2019  CC: No chief complaint on file.   HPI:  Mark Montgomery is a 43 y.o.  male who is here today for annual health maintenance exam.   Past Medical History:  Diagnosis Date  . Allergic rhinitis   . Hypertension   . Laryngopharyngeal reflux (LPR) 2020   Sore throat: GI and ENT eval reassuring.  + hx of palpitations as well  Past Surgical History:  Procedure Laterality Date  . flexible laryngoscopy  09/27/2018   normal  . TOE SURGERY Right 2006   Great Toe    Family History  Problem Relation Age of Onset  . Hearing loss Mother   . Heart disease Mother   . Hypertension Mother   . Heart attack Father   . Heart disease Sister   . Heart disease Paternal Grandmother   . Hypertension Paternal Grandmother   . Colon cancer Neg Hx   . Esophageal cancer Neg Hx   . Rectal cancer Neg Hx     Social History   Socioeconomic History  . Marital status: Divorced    Spouse name: Not on file  . Number of children: 0  . Years of education: Not on file  . Highest education level: Not on file  Occupational History  . Occupation: Psychologist, occupational  Social Needs  . Financial resource strain: Not on file  . Food insecurity    Worry: Not on file    Inability: Not on file  . Transportation needs    Medical: Not on file    Non-medical: Not on file  Tobacco Use  . Smoking status: Passive Smoke Exposure - Never Smoker  . Smokeless tobacco: Never Used  Substance and Sexual Activity  . Alcohol use: Yes    Comment: occasionally  . Drug use: Never  . Sexual activity: Not on file  Lifestyle  . Physical activity    Days per week: Not on file    Minutes per session: Not on file  . Stress: Not on file  Relationships  . Social Musician on phone: Not on file    Gets together: Not on file    Attends religious service: Not on file    Active member of club or organization: Not on file    Attends meetings of clubs or organizations: Not on file   Relationship status: Not on file  . Intimate partner violence    Fear of current or ex partner: Not on file    Emotionally abused: Not on file    Physically abused: Not on file    Forced sexual activity: Not on file  Other Topics Concern  . Not on file  Social History Narrative   Divorced,no children.   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   No tobacco.   Alc: occ/social.    Outpatient Medications Prior to Visit  Medication Sig Dispense Refill  . atenolol (TENORMIN) 100 MG tablet Take 1 tablet (100 mg total) by mouth daily. 90 tablet 3  . fluticasone (FLONASE) 50 MCG/ACT nasal spray Place 1 spray into both nostrils daily as needed.    Marland Kitchen ibuprofen (ADVIL,MOTRIN) 400 MG tablet Take 1 tablet by mouth 2 (two) times daily as needed.    . loratadine (CLARITIN) 10 MG tablet Take 1 tablet by mouth daily as needed.     No facility-administered medications prior to visit.     No Known Allergies  ROS *** PE; There were no  vitals taken for this visit. *** Pertinent labs:  No results found for: TSH Lab Results  Component Value Date   WBC 7.6 12/10/2017   HGB 15.6 12/10/2017   HCT 48 12/10/2017   PLT 345 12/10/2017   Lab Results  Component Value Date   CREATININE 0.9 12/10/2017   BUN 16 12/10/2017   NA 140 12/10/2017   K 5.2 12/10/2017   Lab Results  Component Value Date   ALT 20 12/10/2017   AST 18 12/10/2017   ALKPHOS 96 12/10/2017   Lab Results  Component Value Date   CHOL 203 (A) 12/10/2017   Lab Results  Component Value Date   HDL 44 12/10/2017   Lab Results  Component Value Date   LDLCALC 117 12/10/2017   Lab Results  Component Value Date   TRIG 211 (A) 12/10/2017   No results found for: CHOLHDL Lab Results  Component Value Date   PSA 0.7 12/10/2017   No results found for: HGBA1C   ASSESSMENT AND PLAN:   Health maintenance exam: Reviewed age and gender appropriate health maintenance issues  (prudent diet, regular exercise, health risks of tobacco and excessive alcohol, use of seatbelts, fire alarms in home, use of sunscreen).  Also reviewed age and gender appropriate health screening as well as vaccine recommendations. Vaccines: Flu vaccine->***    Tdap UTD. Labs: fasting HP. Prostate ca screening: he got PSA testing last year->*** Colon ca screening: average risk patient= as per latest guidelines, start screening at 39 yrs of age.  An After Visit Summary was printed and given to the patient.  FOLLOW UP:  No follow-ups on file.  Signed:  Crissie Sickles, MD           05/05/2019

## 2019-05-07 LAB — NOVEL CORONAVIRUS, NAA: SARS-CoV-2, NAA: NOT DETECTED

## 2019-06-02 ENCOUNTER — Ambulatory Visit (INDEPENDENT_AMBULATORY_CARE_PROVIDER_SITE_OTHER): Payer: PRIVATE HEALTH INSURANCE | Admitting: Family Medicine

## 2019-06-02 ENCOUNTER — Encounter: Payer: Self-pay | Admitting: Family Medicine

## 2019-06-02 ENCOUNTER — Other Ambulatory Visit: Payer: Self-pay

## 2019-06-02 VITALS — BP 143/88 | HR 57 | Temp 98.5°F | Resp 16 | Ht 68.0 in | Wt 188.4 lb

## 2019-06-02 DIAGNOSIS — J302 Other seasonal allergic rhinitis: Secondary | ICD-10-CM | POA: Diagnosis not present

## 2019-06-02 DIAGNOSIS — I10 Essential (primary) hypertension: Secondary | ICD-10-CM | POA: Diagnosis not present

## 2019-06-02 MED ORDER — FLUTICASONE PROPIONATE 50 MCG/ACT NA SUSP: 2.0000 | Freq: Every day | NASAL | 6 refills | Status: DC | PRN

## 2019-06-02 MED ORDER — ATENOLOL 100 MG PO TABS: 100.0000 mg | ORAL_TABLET | Freq: Every day | ORAL | 3 refills | Status: DC

## 2019-06-02 MED ORDER — LORATADINE 10 MG PO TABS: 10.0000 mg | ORAL_TABLET | Freq: Every day | ORAL | 6 refills | Status: DC | PRN

## 2019-06-02 NOTE — Progress Notes (Signed)
Office Note 06/02/2019  CC:  Chief Complaint  Patient presents with  . Annual Exam    pt is fasting   HPI:  Mark Montgomery is a 43 y.o. male who is here for f/u HTN. 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  Got flu vaccine 1 mo ago. Reviewed labs today from 8/19->CMET normal, CBC, FLP, PSA all normal.   HTN: home bp monitoring consistently 130/80 avg. Uses flonase and claritin on prn basis.  Has felt well lately.  Past Medical History:  Diagnosis Date  . Allergic rhinitis   . Hypertension   . Laryngopharyngeal reflux (LPR) 2020   Sore throat: GI and ENT eval reassuring.    Past Surgical History:  Procedure Laterality Date  . flexible laryngoscopy  09/27/2018   normal  . TOE SURGERY Right 2006   Great Toe    Family History  Problem Relation Age of Onset  . Hearing loss Mother   . Heart disease Mother   . Hypertension Mother   . Heart attack Father   . Heart disease Sister   . Heart disease Paternal Grandmother   . Hypertension Paternal Grandmother   . Colon cancer Neg Hx   . Esophageal cancer Neg Hx   . Rectal cancer Neg Hx     Social History   Socioeconomic History  . Marital status: Divorced    Spouse name: Not on file  . Number of children: 0  . Years of education: Not on file  . Highest education level: Not on file  Occupational History  . Occupation: Psychologist, occupational  Social Needs  . Financial resource strain: Not on file  . Food insecurity    Worry: Not on file    Inability: Not on file  . Transportation needs    Medical: Not on file    Non-medical: Not on file  Tobacco Use  . Smoking status: Passive Smoke Exposure - Never Smoker  . Smokeless tobacco: Never Used  Substance and Sexual Activity  . Alcohol use: Yes    Comment: occasionally  . Drug use: Never  . Sexual activity: Not on file  Lifestyle  . Physical activity    Days per week: Not on file    Minutes per session: Not on file  . Stress: Not on file   Relationships  . Social Musician on phone: Not on file    Gets together: Not on file    Attends religious service: Not on file    Active member of club or organization: Not on file    Attends meetings of clubs or organizations: Not on file    Relationship status: Not on file  . Intimate partner violence    Fear of current or ex partner: Not on file    Emotionally abused: Not on file    Physically abused: Not on file    Forced sexual activity: Not on file  Other Topics Concern  . Not on file  Social History Narrative   Divorced,no children.   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   No tobacco.   Alc: occ/social.    Outpatient Medications Prior to Visit  Medication Sig Dispense Refill  . atenolol (TENORMIN) 100 MG tablet Take 1 tablet (100 mg total) by mouth daily. 90 tablet 3  . ibuprofen (ADVIL,MOTRIN) 400 MG tablet Take 1 tablet by mouth 2 (two) times daily as needed.    . fluticasone (FLONASE) 50  MCG/ACT nasal spray Place 1 spray into both nostrils daily as needed.    . loratadine (CLARITIN) 10 MG tablet Take 1 tablet by mouth daily as needed.     No facility-administered medications prior to visit.     No Known Allergies  ROS Review of Systems  Constitutional: Negative for appetite change, chills, fatigue and fever.  HENT: Negative for congestion, dental problem, ear pain and sore throat.   Eyes: Negative for discharge, redness and visual disturbance.  Respiratory: Negative for cough, chest tightness, shortness of breath and wheezing.   Cardiovascular: Negative for chest pain, palpitations and leg swelling.  Gastrointestinal: Negative for abdominal pain, blood in stool, diarrhea, nausea and vomiting.  Genitourinary: Negative for difficulty urinating, dysuria, flank pain, frequency, hematuria and urgency.  Musculoskeletal: Negative for arthralgias, back pain, joint swelling, myalgias and neck stiffness.  Skin:  Negative for pallor and rash.  Neurological: Negative for dizziness, speech difficulty, weakness and headaches.  Hematological: Negative for adenopathy. Does not bruise/bleed easily.  Psychiatric/Behavioral: Negative for confusion and sleep disturbance. The patient is not nervous/anxious.     PE;  Blood pressure (!) 143/88, pulse (!) 57, temperature 98.5 F (36.9 C), temperature source Temporal, resp. rate 16, height 5\' 8"  (1.727 m), weight 188 lb 6.4 oz (85.5 kg), SpO2 100 %. Body mass index is 28.65 kg/m.  Gen: Alert, well appearing.  Patient is oriented to person, place, time, and situation. AFFECT: pleasant, lucid thought and speech. CV: RRR, no m/r/g.   LUNGS: CTA bilat, nonlabored resps, good aeration in all lung fields. EXT: no clubbing or cyanosis.  no edema.   Pertinent labs:  No results found for: TSH Lab Results  Component Value Date   WBC 7.6 12/10/2017   HGB 15.6 12/10/2017   HCT 48 12/10/2017   PLT 345 12/10/2017   Lab Results  Component Value Date   CREATININE 0.9 12/10/2017   BUN 16 12/10/2017   NA 140 12/10/2017   K 5.2 12/10/2017   Lab Results  Component Value Date   ALT 20 12/10/2017   AST 18 12/10/2017   ALKPHOS 96 12/10/2017   Lab Results  Component Value Date   CHOL 203 (A) 12/10/2017   Lab Results  Component Value Date   HDL 44 12/10/2017   Lab Results  Component Value Date   LDLCALC 117 12/10/2017   Lab Results  Component Value Date   TRIG 211 (A) 12/10/2017   No results found for: CHOLHDL Lab Results  Component Value Date   PSA 0.7 12/10/2017   ASSESSMENT AND PLAN:   HTN: The current medical regimen is effective;  continue present plan and medications. 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  Reassured pt. RF'd atenolol 100 mg qd today.  Seasonal allergic rhinitis: RF'd claritin and flonase for him to take prn.  An After Visit Summary was printed and given to the patient.  FOLLOW UP:  Return in about 1  year (around 06/01/2020) for routine chronic illness f/u.  Signed:  Crissie Sickles, MD           06/02/2019

## 2020-02-02 LAB — CBC AND DIFFERENTIAL
HCT: 48 (ref 41–53)
Hemoglobin: 15.5 (ref 13.5–17.5)
Platelets: 291 (ref 150–399)
WBC: 8.1

## 2020-02-02 LAB — COMPREHENSIVE METABOLIC PANEL
Calcium: 8.8 (ref 8.7–10.7)
GFR calc Af Amer: >90
GFR calc non Af Amer: 90

## 2020-02-02 LAB — CBC: RBC: 5.33 — AB (ref 3.87–5.11)

## 2020-02-02 LAB — BASIC METABOLIC PANEL
BUN: 16 (ref 4–21)
CO2: 30 — AB (ref 13–22)
Chloride: 107 (ref 99–108)
Creatinine: 1 (ref <=1.3)
Glucose: 110
Potassium: 5.1 (ref 3.4–5.3)
Sodium: 140 (ref 137–147)

## 2020-05-23 ENCOUNTER — Ambulatory Visit: Payer: PRIVATE HEALTH INSURANCE | Admitting: Family Medicine

## 2020-06-06 ENCOUNTER — Other Ambulatory Visit: Payer: Self-pay

## 2020-06-06 ENCOUNTER — Encounter: Payer: Self-pay | Admitting: Family Medicine

## 2020-06-06 ENCOUNTER — Ambulatory Visit: Payer: PRIVATE HEALTH INSURANCE | Admitting: Family Medicine

## 2020-06-06 VITALS — BP 140/84 | HR 55 | Temp 97.7°F | Resp 16 | Ht 68.0 in | Wt 190.8 lb

## 2020-06-06 DIAGNOSIS — Z0000 Encounter for general adult medical examination without abnormal findings: Secondary | ICD-10-CM | POA: Diagnosis not present

## 2020-06-06 DIAGNOSIS — R7301 Impaired fasting glucose: Secondary | ICD-10-CM | POA: Diagnosis not present

## 2020-06-06 DIAGNOSIS — I10 Essential (primary) hypertension: Secondary | ICD-10-CM | POA: Diagnosis not present

## 2020-06-06 MED ORDER — ATENOLOL 100 MG PO TABS: 100.0000 mg | ORAL_TABLET | Freq: Every day | ORAL | 3 refills | Status: DC

## 2020-06-06 MED ORDER — FLUTICASONE PROPIONATE 50 MCG/ACT NA SUSP: 2.0000 | Freq: Every day | NASAL | 6 refills | Status: DC | PRN

## 2020-06-06 NOTE — Patient Instructions (Signed)

## 2020-06-06 NOTE — Progress Notes (Signed)
Office Note 06/06/2020  CC:  Chief Complaint  Patient presents with  . Follow-up    RCI, pt is fasting    HPI:  Mark Montgomery is a 44 y.o. male who is here for annual health maintenance exam and f/u HTN. Reviewed labs brought in by pt today done via [facility] from 02/02/20: CMET all normal except glucose 110--FASTING. CBC (no diff) normal.  HTN: he's compliant with med.   Home bp 120s/70s consistently at home. Taking atenolol 100mg  qd. No smoking, only occ drinking.  Past Medical History:  Diagnosis Date  . Allergic rhinitis   . Hypertension   . Laryngopharyngeal reflux (LPR) 2020   Sore throat: GI and ENT eval reassuring.    Past Surgical History:  Procedure Laterality Date  . flexible laryngoscopy  09/27/2018   normal  . TOE SURGERY Right 2006   Great Toe    Family History  Problem Relation Age of Onset  . Hearing loss Mother   . Heart disease Mother   . Hypertension Mother   . Heart attack Father   . Heart disease Sister   . Heart disease Paternal Grandmother   . Hypertension Paternal Grandmother   . Colon cancer Neg Hx   . Esophageal cancer Neg Hx   . Rectal cancer Neg Hx     Social History   Socioeconomic History  . Marital status: Divorced    Spouse name: Not on file  . Number of children: 0  . Years of education: Not on file  . Highest education level: Not on file  Occupational History  . Occupation: 2007  Tobacco Use  . Smoking status: Passive Smoke Exposure - Never Smoker  . Smokeless tobacco: Never Used  Vaping Use  . Vaping Use: Never used  Substance and Sexual Activity  . Alcohol use: Yes    Comment: occasionally  . Drug use: Never  . Sexual activity: Not on file  Other Topics Concern  . Not on file  Social History Narrative   Divorced,no children.   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   No tobacco.   Alc: occ/social.   Social Determinants of Health    Financial Resource Strain:   . Difficulty of Paying Living Expenses: Not on file  Food Insecurity:   . Worried About [place] in the Last Year: Not on file  . Ran Out of Food in the Last Year: Not on file  Transportation Needs:   . Lack of Transportation (Medical): Not on file  . Lack of Transportation (Non-Medical): Not on file  Physical Activity:   . Days of Exercise per Week: Not on file  . Minutes of Exercise per Session: Not on file  Stress:   . Feeling of Stress : Not on file  Social Connections:   . Frequency of Communication with Friends and Family: Not on file  . Frequency of Social Gatherings with Friends and Family: Not on file  . Attends Religious Services: Not on file  . Active Member of Clubs or Organizations: Not on file  . Attends Programme researcher, broadcasting/film/video Meetings: Not on file  . Marital Status: Not on file  Intimate Partner Violence:   . Fear of Current or Ex-Partner: Not on file  . Emotionally Abused: Not on file  . Physically Abused: Not on file  . Sexually Abused: Not on file    Outpatient Medications Prior to Visit  Medication Sig Dispense Refill  .  ibuprofen (ADVIL,MOTRIN) 400 MG tablet Take 1 tablet by mouth 2 (two) times daily as needed.    Marland Kitchen atenolol (TENORMIN) 100 MG tablet Take 1 tablet (100 mg total) by mouth daily. 90 tablet 3  . loratadine (CLARITIN) 10 MG tablet Take 1 tablet (10 mg total) by mouth daily as needed. (Patient not taking: Reported on 06/06/2020) 30 tablet 6  . fluticasone (FLONASE) 50 MCG/ACT nasal spray Place 2 sprays into both nostrils daily as needed. (Patient not taking: Reported on 06/06/2020) 16 g 6   No facility-administered medications prior to visit.    No Known Allergies  ROS Review of Systems  Constitutional: Negative for appetite change, chills, fatigue and fever.  HENT: Negative for congestion, dental problem, ear pain and sore throat.   Eyes: Negative for discharge, redness and visual disturbance.   Respiratory: Negative for cough, chest tightness, shortness of breath and wheezing.   Cardiovascular: Negative for chest pain, palpitations and leg swelling.  Gastrointestinal: Negative for abdominal pain, blood in stool, diarrhea, nausea and vomiting.  Genitourinary: Negative for difficulty urinating, dysuria, flank pain, frequency, hematuria and urgency.  Musculoskeletal: Negative for arthralgias, back pain, joint swelling, myalgias and neck stiffness.  Skin: Negative for pallor and rash.  Neurological: Negative for dizziness, speech difficulty, weakness and headaches.  Hematological: Negative for adenopathy. Does not bruise/bleed easily.  Psychiatric/Behavioral: Negative for confusion and sleep disturbance. The patient is not nervous/anxious.     PE; Vitals with BMI 06/06/2020 06/02/2019 08/20/2018  Height 5\' 8"  5\' 8"  5\' 8"   Weight 190 lbs 13 oz 188 lbs 6 oz 185 lbs  BMI 29.02 28.65 28.14  Systolic 140 143  Diastolic 84 88 60  Pulse 55 57 76  Rpeat manual bp at end of o/v 140/82.  Gen: Alert, well appearing.  Patient is oriented to person, place, time, and situation. AFFECT: pleasant, lucid thought and speech. ENT: Ears: EACs clear, normal epithelium.  TMs with good light reflex and landmarks bilaterally.  Eyes: no injection, icteris, swelling, or exudate.  EOMI, PERRLA. Nose: no drainage or turbinate edema/swelling.  No injection or focal lesion.  Mouth: lips without lesion/swelling.  Oral mucosa pink and moist.  Dentition intact and without obvious caries or gingival swelling.  Oropharynx without erythema, exudate, or swelling.  Neck: supple/nontender.  No LAD, mass, or TM.  Carotid pulses 2+ bilaterally, without bruits. CV: RRR, no m/r/g.   LUNGS: CTA bilat, nonlabored resps, good aeration in all lung fields. ABD: soft, NT, ND, BS normal.  No hepatospenomegaly or mass.  No bruits. EXT: no clubbing, cyanosis, or edema.  Musculoskeletal: no joint swelling, erythema, warmth, or  tenderness.  ROM of all joints intact. Skin - no sores or suspicious lesions or rashes or color changes   Pertinent labs:  No results found for: TSH Lab Results  Component Value Date   WBC 8.1 02/02/2020   HGB 15.5 02/02/2020   HCT 48 02/02/2020   PLT 291 02/02/2020   Lab Results  Component Value Date   CREATININE 1.0 02/02/2020   BUN 16 02/02/2020   NA 140 02/02/2020   K 5.1 02/02/2020   CL 107 02/02/2020   CO2 30 (A) 02/02/2020   Lab Results  Component Value Date   ALT 23 03/16/2019   AST 19 03/16/2019   ALKPHOS 87 03/16/2019   Lab Results  Component Value Date   CHOL 194 03/16/2019   Lab Results  Component Value Date   HDL 58 03/16/2019   Lab Results  Component Value Date   LDLCALC 121 03/16/2019   Lab Results  Component Value Date   TRIG 74 03/16/2019   No results found for: [facility] Lab Results  Component Value Date   PSA 0.7 03/16/2019   PSA 0.7 12/10/2017    ASSESSMENT AND PLAN:   1) HTN: well controlled per home measurements.  Mildly elevated here today. No changes, RF'd atenolol 100mg , 1 qd. Lytes/cr today.  2) IFG: 113 summer 2020.  110 summer this year. Check fasting gluc today as well as Hba1c. Discussed importance of TLC.  3) Health maintenance exam: Reviewed age and gender appropriate health maintenance issues (prudent diet, regular exercise, health risks of tobacco and excessive alcohol, use of seatbelts, fire alarms in home, use of sunscreen).  Also reviewed age and gender appropriate health screening as well as vaccine recommendations. Vaccines: All UTD.  Flu->he declined.  He's had covid x 2, awaiting booster. Labs: fasting HP labs + Hba1c ordered today. Prostate ca screening: average risk patient= as per latest guidelines, start screening at 31 yrs of age. Colon ca screening: average risk patient= as per latest guidelines, start screening at 33 yrs of age.  An After Visit Summary was printed and given to the patient.  FOLLOW UP:   Return in about 6 months (around 12/04/2020) for routine chronic illness f/u.  Signed:  02/03/2021, MD           06/06/2020

## 2020-06-07 LAB — COMPREHENSIVE METABOLIC PANEL
ALT: 25 U/L (ref 0–53)
AST: 18 U/L (ref 0–37)
Albumin: 5 g/dL (ref 3.5–5.2)
Alkaline Phosphatase: 91 U/L (ref 39–117)
BUN: 14 mg/dL (ref 6–23)
CO2: 29 mEq/L (ref 19–32)
Calcium: 9.8 mg/dL (ref 8.4–10.5)
Chloride: 102 mEq/L (ref 96–112)
Creatinine, Ser: 0.95 mg/dL (ref 0.40–1.50)
GFR: 98.56 mL/min (ref >60.00)
Glucose, Bld: 85 mg/dL (ref 70–99)
Potassium: 4.2 mEq/L (ref 3.5–5.1)
Sodium: 141 mEq/L (ref 135–145)
Total Bilirubin: 0.5 mg/dL (ref 0.2–1.2)
Total Protein: 7.6 g/dL (ref 6.0–8.3)

## 2020-06-07 LAB — LIPID PANEL
Cholesterol: 201 mg/dL — ABNORMAL HIGH (ref 0–200)
HDL: 60 mg/dL (ref >39.00)
LDL Cholesterol: 117 mg/dL — ABNORMAL HIGH (ref 0–99)
NonHDL: 141.13
Total CHOL/HDL Ratio: 3
Triglycerides: 119 mg/dL (ref 0.0–149.0)
VLDL: 23.8 mg/dL (ref 0.0–40.0)

## 2020-06-07 LAB — CBC WITH DIFFERENTIAL/PLATELET
Basophils Absolute: 0.1 10*3/uL (ref 0.0–0.1)
Basophils Relative: 1.2 % (ref 0.0–3.0)
Eosinophils Absolute: 0.2 10*3/uL (ref 0.0–0.7)
Eosinophils Relative: 2.4 % (ref 0.0–5.0)
HCT: 46.8 % (ref 39.0–52.0)
Hemoglobin: 15.8 g/dL (ref 13.0–17.0)
Lymphocytes Relative: 21.4 % (ref 12.0–46.0)
Lymphs Abs: 1.8 10*3/uL (ref 0.7–4.0)
MCHC: 33.8 g/dL (ref 30.0–36.0)
MCV: 87.2 fl (ref 78.0–100.0)
Monocytes Absolute: 0.6 10*3/uL (ref 0.1–1.0)
Monocytes Relative: 7.4 % (ref 3.0–12.0)
Neutro Abs: 5.6 10*3/uL (ref 1.4–7.7)
Neutrophils Relative %: 67.6 % (ref 43.0–77.0)
Platelets: 312 10*3/uL (ref 150.0–400.0)
RBC: 5.37 Mil/uL (ref 4.22–5.81)
RDW: 13.3 % (ref 11.5–15.5)
WBC: 8.3 10*3/uL (ref 4.0–10.5)

## 2020-06-07 LAB — HEMOGLOBIN A1C: Hgb A1c MFr Bld: 5.6 % (ref 4.6–6.5)

## 2020-06-07 LAB — TSH: TSH: 2.03 u[IU]/mL (ref 0.35–4.50)

## 2020-06-08 ENCOUNTER — Telehealth: Payer: Self-pay | Admitting: Family Medicine

## 2020-06-08 NOTE — Telephone Encounter (Signed)
Patient called for recent lab results. I relayed Dr. Samul Dada note that everything was normal.

## 2020-12-05 NOTE — Progress Notes (Signed)
OFFICE VISIT  12/06/2020  CC:  Chief Complaint  Patient presents with  . Follow-up    HTN, IFG    HPI:    Patient is a 45 y.o. male who presents for 6 mo f/u HTN and IFG. A/P as of last visit: "1) HTN: well controlled per home measurements.  Mildly elevated here today. No changes, RF'd atenolol 100mg , 1 qd. Lytes/cr today.  2) IFG: 113 summer 2020.  110 summer this year. Check fasting gluc today as well as Hba1c. Discussed importance of TLC.  3) Health maintenance exam: Reviewed age and gender appropriate health maintenance issues (prudent diet, regular exercise, health risks of tobacco and excessive alcohol, use of seatbelts, fire alarms in home, use of sunscreen).  Also reviewed age and gender appropriate health screening as well as vaccine recommendations. Vaccines: All UTD.  Flu->he declined.  He's had covid x 2, awaiting booster. Labs: fasting HP labs + Hba1c ordered today. Prostate ca screening: average risk patient= as per latest guidelines, start screening at 76 yrs of age. Colon ca screening: average risk patient= as per latest guidelines, start screening at 34 yrs of age."  INTERIM HX: Blood work 6 mo ago all normal, including fasting gluc of 85 and Hba1c of 5.6%. Feeling well.   HTN: checks bp about 1 x/month, usually 130s/70s, HR upper 40s/50s. When he misses a dose of his atenolol he can tell easily: "I can feel my heart beat". No regular exercise.  Making some efforts at low Na diet.   Past Medical History:  Diagnosis Date  . Allergic rhinitis   . Hypertension   . Laryngopharyngeal reflux (LPR) 2020   Sore throat: GI and ENT eval reassuring.    Past Surgical History:  Procedure Laterality Date  . flexible laryngoscopy  09/27/2018   normal  . TOE SURGERY Right 2006   Great Toe    Outpatient Medications Prior to Visit  Medication Sig Dispense Refill  . atenolol (TENORMIN) 100 MG tablet Take 1 tablet (100 mg total) by mouth daily. 90 tablet 3  .  fluticasone (FLONASE) 50 MCG/ACT nasal spray Place 2 sprays into both nostrils daily as needed. 16 g 6  . ibuprofen (ADVIL,MOTRIN) 400 MG tablet Take 1 tablet by mouth 2 (two) times daily as needed.    . loratadine (CLARITIN) 10 MG tablet Take 1 tablet (10 mg total) by mouth daily as needed. (Patient not taking: No sig reported) 30 tablet 6   No facility-administered medications prior to visit.    No Known Allergies  ROS As per HPI  PE: Vitals with BMI 12/06/2020 06/06/2020 06/02/2019  Height 5\' 8"  5\' 8"  5\' 8"   Weight 195 lbs 3 oz 190 lbs 13 oz 188 lbs 6 oz  BMI 29.69 29.02 28.65  Systolic 162 140 13/11/2018  Diastolic 88 84 88  Pulse 49 55 57  Manual bp recheck after in room 10 min-->164/92.  Gen: Alert, well appearing.  Patient is oriented to person, place, time, and situation. AFFECT: pleasant, lucid thought and speech. CV: Regular, brady to low 50s, no m/r/g.   LUNGS: CTA bilat, nonlabored resps, good aeration in all lung fields. EXT: no clubbing or cyanosis.  no edema.    LABS:  Lab Results  Component Value Date   TSH 2.03 06/06/2020   Lab Results  Component Value Date   WBC 8.3 06/06/2020   HGB 15.8 06/06/2020   HCT 46.8 06/06/2020   MCV 87.2 06/06/2020   PLT 312.0 06/06/2020   Lab  Results  Component Value Date   CREATININE 0.95 06/06/2020   BUN 14 06/06/2020   NA 141 06/06/2020   K 4.2 06/06/2020   CL 102 06/06/2020   CO2 29 06/06/2020   Lab Results  Component Value Date   ALT 25 06/06/2020   AST 18 06/06/2020   ALKPHOS 91 06/06/2020   BILITOT 0.5 06/06/2020   Lab Results  Component Value Date   CHOL 201 (H) 06/06/2020   Lab Results  Component Value Date   HDL 60.00 06/06/2020   Lab Results  Component Value Date   LDLCALC 117 (H) 06/06/2020   Lab Results  Component Value Date   TRIG 119.0 06/06/2020   Lab Results  Component Value Date   CHOLHDL 3 06/06/2020   Lab Results  Component Value Date   PSA 0.7 03/16/2019   PSA 0.7 12/10/2017    Lab Results  Component Value Date   HGBA1C 5.6 06/06/2020   IMPRESSION AND PLAN:  HTN, elevated bp today. Not enough home bp monitoring to compare with, but the occ measurement out of office is 130s/70s, so he may need low dose of additional bp med (vs white coat syndrome). Plan: monitor bp/hr at home daily and f/u 3 wks to review with me.  No med change today. No labs needed today.  An After Visit Summary was printed and given to the patient.  FOLLOW UP: Return in about 3 weeks (around 12/27/2020) for f/u HTN.  Signed:  Santiago Bumpers, MD           12/06/2020

## 2020-12-06 ENCOUNTER — Encounter: Payer: Self-pay | Admitting: Family Medicine

## 2020-12-06 ENCOUNTER — Ambulatory Visit (INDEPENDENT_AMBULATORY_CARE_PROVIDER_SITE_OTHER): Payer: No Typology Code available for payment source | Admitting: Family Medicine

## 2020-12-06 ENCOUNTER — Other Ambulatory Visit: Payer: Self-pay

## 2020-12-06 VITALS — BP 162/88 | HR 49 | Temp 97.6°F | Resp 16 | Ht 68.0 in | Wt 195.2 lb

## 2020-12-06 DIAGNOSIS — I10 Essential (primary) hypertension: Secondary | ICD-10-CM | POA: Diagnosis not present

## 2020-12-06 NOTE — Patient Instructions (Signed)
Check your blood pressure and heart rage once daily and write these numbers down to review with me at f/u office visit in 3 wks.

## 2020-12-31 NOTE — Progress Notes (Signed)
OFFICE VISIT  01/01/2021  CC:  Chief Complaint  Patient presents with  . Follow-up    Hypertension, pt is not fasting. Has been checking home bp and HR daily for the past 2-3 weeks. Brought home bp cuff machine   HPI:    Patient is a 45 y.o. male who presents for 3 week f/u hypertension. A/P as of last visit: "HTN, elevated bp today. Not enough home bp monitoring to compare with, but the occ measurement out of office is 130s/70s, so he may need low dose of additional bp med (vs white coat syndrome). Plan: monitor bp/hr at home daily and f/u 3 wks to review with me.  No med change today. No labs needed today."  INTERIM HX: Feeling well.  Home bp's reviewed and they avg 125 systolic and 70 diastolic.  Highest syst 139, highest diast 82. Lowest syst 108, lowest diast 58.  HR 40-50.   Past Medical History:  Diagnosis Date  . Allergic rhinitis   . Hypertension   . Laryngopharyngeal reflux (LPR) 2020   Sore throat: GI and ENT eval reassuring.    Past Surgical History:  Procedure Laterality Date  . flexible laryngoscopy  09/27/2018   normal  . TOE SURGERY Right 2006   Great Toe    Outpatient Medications Prior to Visit  Medication Sig Dispense Refill  . atenolol (TENORMIN) 100 MG tablet Take 1 tablet (100 mg total) by mouth daily. 90 tablet 3  . ibuprofen (ADVIL,MOTRIN) 400 MG tablet Take 1 tablet by mouth 2 (two) times daily as needed.    . fluticasone (FLONASE) 50 MCG/ACT nasal spray Place 2 sprays into both nostrils daily as needed. (Patient not taking: Reported on 01/01/2021) 16 g 6  . loratadine (CLARITIN) 10 MG tablet Take 1 tablet (10 mg total) by mouth daily as needed. (Patient not taking: No sig reported) 30 tablet 6   No facility-administered medications prior to visit.    No Known Allergies  ROS As per HPI  PE: Vitals with BMI 01/01/2021 12/06/2020 06/06/2020  Height 5\' 8"  5\' 8"  5\' 8"   Weight 193 lbs 195 lbs 3 oz 190 lbs 13 oz  BMI 29.35 29.69 29.02  Systolic  135 162 140  Diastolic 72 88 84  Pulse 49 49 55   Gen: Alert, well appearing.  Patient is oriented to person, place, time, and situation. AFFECT: pleasant, lucid thought and speech. CV: Regular rhythm, rate 50, no m/r/g.   LUNGS: CTA bilat, nonlabored resps, good aeration in all lung fields. EXT: no clubbing or cyanosis.  no edema.    LABS:    Chemistry      Component Value Date/Time   NA 141 06/06/2020 1516   NA 140 02/02/2020 0000   K 4.2 06/06/2020 1516   CL 102 06/06/2020 1516   CO2 29 06/06/2020 1516   BUN 14 06/06/2020 1516   BUN 16 02/02/2020 0000   CREATININE 0.95 06/06/2020 1516   GLU 110 02/02/2020 0000      Component Value Date/Time   CALCIUM 9.8 06/06/2020 1516   ALKPHOS 91 06/06/2020 1516   AST 18 06/06/2020 1516   ALT 25 06/06/2020 1516   BILITOT 0.5 06/06/2020 1516     Lab Results  Component Value Date   CHOL 201 (H) 06/06/2020   HDL 60.00 06/06/2020   LDLCALC 117 (H) 06/06/2020   TRIG 119.0 06/06/2020   CHOLHDL 3 06/06/2020   Lab Results  Component Value Date   TSH 2.03 06/06/2020  Lab Results  Component Value Date   WBC 8.3 06/06/2020   HGB 15.8 06/06/2020   HCT 46.8 06/06/2020   MCV 87.2 06/06/2020   PLT 312.0 06/06/2020   IMPRESSION AND PLAN:  HTN; well controlled on atenolol 100mg  qd. He [place] getting a cpe[place]dot through the city soon and gets labs and ekg with this. He will drop off a copy of these for me to review.  An After Visit Summary was printed and given to the patient.  FOLLOW UP: Return in about 1 year (around 01/01/2022) for annual CPE (fasting).  Signed:  03/03/2022, MD           01/01/2021

## 2021-01-01 ENCOUNTER — Encounter: Payer: Self-pay | Admitting: Family Medicine

## 2021-01-01 ENCOUNTER — Other Ambulatory Visit: Payer: Self-pay

## 2021-01-01 ENCOUNTER — Ambulatory Visit (INDEPENDENT_AMBULATORY_CARE_PROVIDER_SITE_OTHER): Payer: No Typology Code available for payment source | Admitting: Family Medicine

## 2021-01-01 VITALS — BP 135/72 | HR 49 | Temp 97.8°F | Resp 16 | Ht 68.0 in | Wt 193.0 lb

## 2021-01-01 DIAGNOSIS — I10 Essential (primary) hypertension: Secondary | ICD-10-CM | POA: Diagnosis not present

## 2021-01-07 LAB — CBC AND DIFFERENTIAL
HCT: 48 (ref 41–53)
Hemoglobin: 15.9 (ref 13.5–17.5)
Platelets: 300 (ref 150–399)
WBC: 7.4

## 2021-01-07 LAB — CBC: RBC: 5.39 — AB (ref 3.87–5.11)

## 2021-05-28 ENCOUNTER — Other Ambulatory Visit: Payer: Self-pay | Admitting: Family Medicine

## 2021-07-28 DIAGNOSIS — M7752 Other enthesopathy of left foot: Secondary | ICD-10-CM

## 2021-07-28 HISTORY — DX: Other enthesopathy of left foot and ankle: M77.52

## 2021-10-17 ENCOUNTER — Telehealth: Payer: Self-pay

## 2021-10-17 MED ORDER — ATENOLOL 100 MG PO TABS: 100.0000 mg | ORAL_TABLET | Freq: Every day | ORAL | 0 refills | Status: DC

## 2021-10-17 NOTE — Addendum Note (Signed)
Addended by: Emi Holes D on: 10/17/2021 09:33 AM ? ? Modules accepted: Orders ? ?

## 2021-10-17 NOTE — Telephone Encounter (Signed)
Patient refill request. Patient has next appt for CPE on 01/02/22 with Dr. Milinda Cave.  His bottle states no more refills. ? ?Please change preferred pharmacy to [facility] ? ?atenolol (TENORMIN) 100 MG tablet [[id number]]  ? ? ?

## 2021-10-17 NOTE — Telephone Encounter (Signed)
Pt advised refill sent. Pharmacy updated ?

## 2022-01-02 ENCOUNTER — Encounter: Payer: Self-pay | Admitting: Family Medicine

## 2022-01-02 ENCOUNTER — Ambulatory Visit (INDEPENDENT_AMBULATORY_CARE_PROVIDER_SITE_OTHER): Payer: No Typology Code available for payment source | Admitting: Family Medicine

## 2022-01-02 VITALS — BP 161/73 | HR 51 | Temp 98.1°F | Ht 71.0 in | Wt 193.2 lb

## 2022-01-02 DIAGNOSIS — I10 Essential (primary) hypertension: Secondary | ICD-10-CM | POA: Diagnosis not present

## 2022-01-02 DIAGNOSIS — Z1211 Encounter for screening for malignant neoplasm of colon: Secondary | ICD-10-CM

## 2022-01-02 DIAGNOSIS — Z0000 Encounter for general adult medical examination without abnormal findings: Secondary | ICD-10-CM

## 2022-01-02 LAB — CBC
HCT: 47.8 % (ref 39.0–52.0)
Hemoglobin: 15.7 g/dL (ref 13.0–17.0)
MCHC: 32.8 g/dL (ref 30.0–36.0)
MCV: 89.3 fl (ref 78.0–100.0)
Platelets: 289 10*3/uL (ref 150.0–400.0)
RBC: 5.36 Mil/uL (ref 4.22–5.81)
RDW: 13.8 % (ref 11.5–15.5)
WBC: 8.1 10*3/uL (ref 4.0–10.5)

## 2022-01-02 LAB — COMPREHENSIVE METABOLIC PANEL
ALT: 19 U/L (ref 0–53)
AST: 15 U/L (ref 0–37)
Albumin: 4.6 g/dL (ref 3.5–5.2)
Alkaline Phosphatase: 86 U/L (ref 39–117)
BUN: 12 mg/dL (ref 6–23)
CO2: 27 mEq/L (ref 19–32)
Calcium: 9.6 mg/dL (ref 8.4–10.5)
Chloride: 105 mEq/L (ref 96–112)
Creatinine, Ser: 0.96 mg/dL (ref 0.40–1.50)
GFR: 96.26 mL/min (ref >60.00)
Glucose, Bld: 91 mg/dL (ref 70–99)
Potassium: 4.3 mEq/L (ref 3.5–5.1)
Sodium: 141 mEq/L (ref 135–145)
Total Bilirubin: 0.6 mg/dL (ref 0.2–1.2)
Total Protein: 7.1 g/dL (ref 6.0–8.3)

## 2022-01-02 LAB — LIPID PANEL
Cholesterol: 215 mg/dL — ABNORMAL HIGH (ref 0–200)
HDL: 55.1 mg/dL (ref >39.00)
LDL Cholesterol: 135 mg/dL — ABNORMAL HIGH (ref 0–99)
NonHDL: 160.28
Total CHOL/HDL Ratio: 4
Triglycerides: 126 mg/dL (ref 0.0–149.0)
VLDL: 25.2 mg/dL (ref 0.0–40.0)

## 2022-01-02 LAB — TSH: TSH: 1.89 u[IU]/mL (ref 0.35–5.50)

## 2022-01-02 MED ORDER — ATENOLOL 100 MG PO TABS: 100.0000 mg | ORAL_TABLET | Freq: Every day | ORAL | 3 refills | Status: DC

## 2022-01-02 NOTE — Progress Notes (Signed)
Office Note 01/02/2022  CC:  Chief Complaint  Patient presents with   Annual Exam    Pt is fasting   Patient is a 46 y.o. male who is here for annual health maintenance exam and f/u HTN.  INTERIM HX: I last saw him in June 2022 and his blood pressure was well controlled on atenolol 100 mg a day.    He's been feeling well, no concerns. 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 He says his home blood pressures when checked several times a month are consistently around 120 over 60s.  Heart rate in the 50s or 60s.  He does get anxious when getting blood pressure checked in a medical setting.  Past Medical History:  Diagnosis Date   Allergic rhinitis    Hypertension    Laryngopharyngeal reflux (LPR) 2020   Sore throat: GI and ENT eval reassuring.    Past Surgical History:  Procedure Laterality Date   flexible laryngoscopy  09/27/2018   normal   TOE SURGERY Right 2006   Great Toe    Family History  Problem Relation Age of Onset   Hearing loss Mother    Heart disease Mother    Hypertension Mother    Heart attack Father    Heart disease Sister    Heart disease Paternal Grandmother    Hypertension Paternal Grandmother    Colon cancer Neg Hx    Esophageal cancer Neg Hx    Rectal cancer Neg Hx     Social History   Socioeconomic History   Marital status: Divorced    Spouse name: Not on file   Number of children: 0   Years of education: Not on file   Highest education level: Not on file  Occupational History   Occupation: Psychologist, occupational  Tobacco Use   Smoking status: Never    Passive exposure: Yes   Smokeless tobacco: Never  Vaping Use   Vaping Use: Never used  Substance and Sexual Activity   Alcohol use: Yes    Comment: occasionally   Drug use: Never   Sexual activity: Not on file  Other Topics Concern   Not on file  Social History Narrative   Divorced,no children.   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   No tobacco.   Alc: occ/social.   Social Determinants of Health   Financial Resource Strain: Not on file  Food Insecurity: Not on file  Transportation Needs: Not on file  Physical Activity: Not on file  Stress: Not on file  Social Connections: Not on file  Intimate Partner Violence: Not on file    Outpatient Medications Prior to Visit  Medication Sig Dispense Refill   fluticasone (FLONASE) 50 MCG/ACT nasal spray Place 2 sprays into both nostrils daily as needed. (Patient not taking: Reported on 01/01/2021) 16 g 6   ibuprofen (ADVIL,MOTRIN) 400 MG tablet Take 1 tablet by mouth 2 (two) times daily as needed. (Patient not taking: Reported on 01/02/2022)     loratadine (CLARITIN) 10 MG tablet Take 1 tablet (10 mg total) by mouth daily as needed. (Patient not taking: Reported on 06/06/2020) 30 tablet 6   atenolol (TENORMIN) 100 MG tablet Take 1 tablet (100 mg total) by mouth daily. 90 tablet 0   No facility-administered medications prior to visit.    No Known Allergies  ROS Review of Systems  Constitutional:  Negative for appetite change, chills, fatigue and fever.  HENT:  Negative for congestion, dental problem, ear pain and sore throat.   Eyes:  Negative for discharge, redness and visual disturbance.  Respiratory:  Negative for cough, chest tightness, shortness of breath and wheezing.   Cardiovascular:  Negative for chest pain, palpitations and leg swelling.  Gastrointestinal:  Negative for abdominal pain, blood in stool, diarrhea, nausea and vomiting.  Genitourinary:  Negative for difficulty urinating, dysuria, flank pain, frequency, hematuria and urgency.  Musculoskeletal:  Negative for arthralgias, back pain, joint swelling, myalgias and neck stiffness.  Skin:  Negative for pallor and rash.  Neurological:  Negative for dizziness, speech difficulty, weakness and headaches.  Hematological:  Negative for adenopathy. Does not bruise/bleed easily.   Psychiatric/Behavioral:  Negative for confusion and sleep disturbance. The patient is not nervous/anxious.     PE;    01/02/2022    8:04 AM 01/01/2021    8:27 AM 12/06/2020    8:09 AM  Vitals with BMI  Height 5\' 11"  5\' 8"  5\' 8"   Weight 193 lbs 3 oz 193 lbs 195 lbs 3 oz  BMI 26.96 29.35 29.69  Systolic 161 135  Diastolic 73 72 88  Pulse 51 49 49    Gen: Alert, well appearing.  Patient is oriented to person, place, time, and situation. AFFECT: pleasant, lucid thought and speech. ENT: Ears: EACs clear, normal epithelium.  TMs with good light reflex and landmarks bilaterally.  Eyes: no injection, icteris, swelling, or exudate.  EOMI, PERRLA. Nose: no drainage or turbinate edema/swelling.  No injection or focal lesion.  Mouth: lips without lesion/swelling.  Oral mucosa pink and moist.  Dentition intact and without obvious caries or gingival swelling.  Oropharynx without erythema, exudate, or swelling.  Neck: supple/nontender.  No LAD, mass, or TM.  Carotid pulses 2+ bilaterally, without bruits. CV: RRR, no m/r/g.   LUNGS: CTA bilat, nonlabored resps, good aeration in all lung fields. ABD: soft, NT, ND, BS normal.  No hepatospenomegaly or mass.  No bruits. EXT: no clubbing, cyanosis, or edema.  Musculoskeletal: no joint swelling, erythema, warmth, or tenderness.  ROM of all joints intact. Skin - no sores or suspicious lesions or rashes or color changes  Pertinent labs:  Lab Results  Component Value Date   TSH 2.03 06/06/2020   Lab Results  Component Value Date   WBC 7.4 01/07/2021   HGB 15.9 01/07/2021   HCT 48 01/07/2021   MCV 87.2 06/06/2020   PLT 300 01/07/2021   Lab Results  Component Value Date   CREATININE 0.95 06/06/2020   BUN 14 06/06/2020   NA 141 06/06/2020   K 4.2 06/06/2020   CL 102 06/06/2020   CO2 29 06/06/2020   Lab Results  Component Value Date   ALT 25 06/06/2020   AST 18 06/06/2020   ALKPHOS 91 06/06/2020   BILITOT 0.5 06/06/2020   Lab Results   Component Value Date   CHOL 201 (H) 06/06/2020   Lab Results  Component Value Date   HDL 60.00 06/06/2020   Lab Results  Component Value Date   LDLCALC 117 (H) 06/06/2020   Lab Results  Component Value Date   TRIG 119.0 06/06/2020   Lab Results  Component Value Date   CHOLHDL 3 06/06/2020   Lab Results  Component Value Date   PSA 0.7 03/16/2019   PSA 0.7 12/10/2017   Lab Results  Component Value Date   HGBA1C 5.6 06/06/2020   ASSESSMENT AND PLAN:   No problem-specific Assessment & Plan notes found for this  encounter.  1) Health maintenance exam: Reviewed age and gender appropriate health maintenance issues (prudent diet, regular exercise, health risks of tobacco and excessive alcohol, use of seatbelts, fire alarms in home, use of sunscreen).  Also reviewed age and gender appropriate health screening as well as vaccine recommendations. Vaccines: UTD Labs: fasting HP today Prostate ca screening: average risk patient= as per latest guidelines, start screening at 6950 yrs of age. Colon ca screening: due for initial screening next year.  2) HTN: Doing very well on atenolol 100 mg a day. He has a whitecoat component of to his blood pressure. Electrolytes and creatinine today.  An After Visit Summary was printed and given to the patient.  FOLLOW UP:  Return in about 1 year (around 01/03/2023) for annual CPE (fasting).  Signed:  Santiago BumpersPhil Jenniefer Salak, MD           01/02/2022

## 2022-01-02 NOTE — Patient Instructions (Signed)
Health Maintenance, Male Adopting a healthy lifestyle and getting preventive care are important in promoting health and wellness. Ask your health care provider about: The right schedule for you to have regular tests and exams. Things you can do on your own to prevent diseases and keep yourself healthy. What should I know about diet, weight, and exercise? Eat a healthy diet  Eat a diet that includes plenty of vegetables, fruits, low-fat dairy products, and lean protein. Do not eat a lot of foods that are high in solid fats, added sugars, or sodium. Maintain a healthy weight Body mass index (BMI) is a measurement that can be used to identify possible weight problems. It estimates body fat based on height and weight. Your health care provider can help determine your BMI and help you achieve or maintain a healthy weight. Get regular exercise Get regular exercise. This is one of the most important things you can do for your health. Most adults should: Exercise for at least 150 minutes each week. The exercise should increase your heart rate and make you sweat (moderate-intensity exercise). Do strengthening exercises at least twice a week. This is in addition to the moderate-intensity exercise. Spend less time sitting. Even light physical activity can be beneficial. Watch cholesterol and blood lipids Have your blood tested for lipids and cholesterol at 44 years of age, then have this test every 5 years. You may need to have your cholesterol levels checked more often if: Your lipid or cholesterol levels are high. You are older than 44 years of age. You are at high risk for heart disease. What should I know about cancer screening? Many types of cancers can be detected early and may often be prevented. Depending on your health history and family history, you may need to have cancer screening at various ages. This may include screening for: Colorectal cancer. Prostate cancer. Skin cancer. Lung  cancer. What should I know about heart disease, diabetes, and high blood pressure? Blood pressure and heart disease High blood pressure causes heart disease and increases the risk of stroke. This is more likely to develop in people who have high blood pressure readings or are overweight. Talk with your health care provider about your target blood pressure readings. Have your blood pressure checked: Every 3-5 years if you are 18-39 years of age. Every year if you are 40 years old or older. If you are between the ages of 65 and 75 and are a current or former smoker, ask your health care provider if you should have a one-time screening for abdominal aortic aneurysm (AAA). Diabetes Have regular diabetes screenings. This checks your fasting blood sugar level. Have the screening done: Once every three years after age 45 if you are at a normal weight and have a low risk for diabetes. More often and at a younger age if you are overweight or have a high risk for diabetes. What should I know about preventing infection? Hepatitis B If you have a higher risk for hepatitis B, you should be screened for this virus. Talk with your health care provider to find out if you are at risk for hepatitis B infection. Hepatitis C Blood testing is recommended for: Everyone born from 1945 through 1965. Anyone with known risk factors for hepatitis C. Sexually transmitted infections (STIs) You should be screened each year for STIs, including gonorrhea and chlamydia, if: You are sexually active and are younger than 44 years of age. You are older than 44 years of age and your   health care provider tells you that you are at risk for this type of infection. Your sexual activity has changed since you were last screened, and you are at increased risk for chlamydia or gonorrhea. Ask your health care provider if you are at risk. Ask your health care provider about whether you are at high risk for HIV. Your health care provider  may recommend a prescription medicine to help prevent HIV infection. If you choose to take medicine to prevent HIV, you should first get tested for HIV. You should then be tested every 3 months for as long as you are taking the medicine. Follow these instructions at home: Alcohol use Do not drink alcohol if your health care provider tells you not to drink. If you drink alcohol: Limit how much you have to 0-2 drinks a day. Know how much alcohol is in your drink. In the [place] one drink equals one 12 oz bottle of beer (355 mL), one 5 oz glass of wine (148 mL), or one 1 oz glass of hard liquor (44 mL). Lifestyle Do not use any products that contain nicotine or tobacco. These products include cigarettes, chewing tobacco, and vaping devices, such as e-cigarettes. If you need help quitting, ask your health care provider. Do not use street drugs. Do not share needles. Ask your health care provider for help if you need support or information about quitting drugs. General instructions Schedule regular health, dental, and eye exams. Stay current with your vaccines. Tell your health care provider if: You often feel depressed. You have ever been abused or do not feel safe at home. Summary Adopting a healthy lifestyle and getting preventive care are important in promoting health and wellness. Follow your health care provider's instructions about healthy diet, exercising, and getting tested or screened for diseases. Follow your health care provider's instructions on monitoring your cholesterol and blood pressure. This information is not intended to replace advice given to you by your health care provider. Make sure you discuss any questions you have with your health care provider. Document Revised: 12/03/2020 Document Reviewed: 12/03/2020 Elsevier Patient Education  2023 [employer]  

## 2022-02-20 ENCOUNTER — Ambulatory Visit (INDEPENDENT_AMBULATORY_CARE_PROVIDER_SITE_OTHER): Payer: No Typology Code available for payment source

## 2022-02-20 ENCOUNTER — Encounter: Payer: Self-pay | Admitting: Podiatry

## 2022-02-20 ENCOUNTER — Ambulatory Visit: Payer: No Typology Code available for payment source | Admitting: Podiatry

## 2022-02-20 DIAGNOSIS — M778 Other enthesopathies, not elsewhere classified: Secondary | ICD-10-CM | POA: Diagnosis not present

## 2022-02-20 MED ORDER — TRIAMCINOLONE ACETONIDE 40 MG/ML IJ SUSP
20.0000 mg | Freq: Once | INTRAMUSCULAR | Status: AC
  Administered 2022-02-20: 20 mg

## 2022-02-20 NOTE — Progress Notes (Signed)
  Subjective:  Patient ID: Mark Montgomery, male    DOB: 07-13-1976,  MRN: [id number] HPI Chief Complaint  Patient presents with   Foot Pain    Plantar forefoot left - aching x 1 month, no injury, feels like a bunched up sock in shoe, sometimes makes toes sore, worse with work boots   New Patient (Initial Visit)    46 y.o. male presents with the above complaint.   ROS: Denies fever chills nausea vomiting muscle aches pains calf pain back pain chest pain shortness of breath.  Past Medical History:  Diagnosis Date   Allergic rhinitis    Hypertension    Laryngopharyngeal reflux (LPR) 2020   Sore throat: GI and ENT eval reassuring.   Past Surgical History:  Procedure Laterality Date   flexible laryngoscopy  09/27/2018   normal   TOE SURGERY Right 2006   Great Toe    Current Outpatient Medications:    atenolol (TENORMIN) 100 MG tablet, Take 1 tablet (100 mg total) by mouth daily., Disp: 90 tablet, Rfl: 3   fluticasone (FLONASE) 50 MCG/ACT nasal spray, Place 2 sprays into both nostrils daily as needed. (Patient not taking: Reported on 01/01/2021), Disp: 16 g, Rfl: 6   ibuprofen (ADVIL,MOTRIN) 400 MG tablet, Take 1 tablet by mouth 2 (two) times daily as needed. (Patient not taking: Reported on 01/02/2022), Disp: , Rfl:    loratadine (CLARITIN) 10 MG tablet, Take 1 tablet (10 mg total) by mouth daily as needed. (Patient not taking: Reported on 06/06/2020), Disp: 30 tablet, Rfl: 6  No Known Allergies Review of Systems Objective:  There were no vitals filed for this visit.  General: Well developed, nourished, in no acute distress, alert and oriented x3   Dermatological: Skin is warm, dry and supple bilateral. Nails x 10 are well maintained; remaining integument appears unremarkable at this time. There are no open sores, no preulcerative lesions, no rash or signs of infection present.  Vascular: Dorsalis Pedis artery and Posterior Tibial artery pedal pulses are 2/4 bilateral with immedate  capillary fill time. Pedal hair growth present. No varicosities and no lower extremity edema present bilateral.   Neruologic: Grossly intact via light touch bilateral. Vibratory intact via tuning fork bilateral. Protective threshold with Semmes Wienstein monofilament intact to all pedal sites bilateral. Patellar and Achilles deep tendon reflexes 2+ bilateral. No Babinski or clonus noted bilateral.   Musculoskeletal: No gross boney pedal deformities bilateral. No pain, crepitus, or limitation noted with foot and ankle range of motion bilateral. Muscular strength 5/5 in all groups tested bilateral.  He has some tenderness on end range of motion of the second metatarsophalangeal joint of the left foot.  Minimal pain on palpation.  Gait: Unassisted, Nonantalgic.    Radiographs:  Radiographs taken today demonstrate slightly elongated second metatarsal of the left no significant osseous abnormalities no acute findings noted.  Assessment & Plan:   Assessment: Capsulitis neuritis sec metatarsophalangeal joint left foot.  Plan: Discussed etiology pathology conservative surgical therapies this is relatively minor at this point I did provide him with an injection of 10 mg of Kenalog around the joint.  See if this will help reduce any of the inflammation we discussed appropriate shoe gear stretching signs ice therapy shoe gear modifications.  Also discussed the possible need for orthotics.     Achsah Mcquade T. [place], [place]

## 2022-05-02 ENCOUNTER — Ambulatory Visit: Payer: No Typology Code available for payment source | Admitting: Family Medicine

## 2022-05-02 ENCOUNTER — Encounter: Payer: Self-pay | Admitting: Family Medicine

## 2022-05-02 VITALS — BP 148/76 | HR 51 | Temp 97.9°F | Ht 71.0 in | Wt 192.2 lb

## 2022-05-02 DIAGNOSIS — K120 Recurrent oral aphthae: Secondary | ICD-10-CM

## 2022-05-02 NOTE — Progress Notes (Signed)
OFFICE VISIT  05/02/2022  CC:  Chief Complaint  Patient presents with   Placement on lip    He bit his lip 1 week ago and knot developed. It does not seem to be getting better.   Patient is a 46 y.o. male who presents for " placed on inside of lip".  HPI: He bit the inside of his lower lip about a week ago.  Hard to tell if he felt like it was a little swollen or irritated prior to biting it.  He says it got better over a few days but then he accidentally bit the same spot and it has felt like a little knot there since.   Past Medical History:  Diagnosis Date   Allergic rhinitis    Capsulitis of toe of left foot 2023   Left second MTP--> Steroid injection by podiatry   Hypertension    Laryngopharyngeal reflux (LPR) 2020   Sore throat: GI and ENT eval reassuring.    Past Surgical History:  Procedure Laterality Date   flexible laryngoscopy  09/27/2018   normal   TOE SURGERY Right 2006   Great Toe    Outpatient Medications Prior to Visit  Medication Sig Dispense Refill   atenolol (TENORMIN) 100 MG tablet Take 1 tablet (100 mg total) by mouth daily. 90 tablet 3   fluticasone (FLONASE) 50 MCG/ACT nasal spray Place 2 sprays into both nostrils daily as needed. (Patient not taking: Reported on 01/01/2021) 16 g 6   ibuprofen (ADVIL,MOTRIN) 400 MG tablet Take 1 tablet by mouth 2 (two) times daily as needed. (Patient not taking: Reported on 01/02/2022)     loratadine (CLARITIN) 10 MG tablet Take 1 tablet (10 mg total) by mouth daily as needed. (Patient not taking: Reported on 06/06/2020) 30 tablet 6   No facility-administered medications prior to visit.    No Known Allergies  ROS As per HPI  PE:    05/02/2022   10:51 AM 01/02/2022    8:04 AM 01/01/2021    8:27 AM  Vitals with BMI  Height 5\' 11"  5\' 11"  5\' 8"   Weight 192 lbs 3 oz 193 lbs 3 oz 193 lbs  BMI 26.82 29.56 21.30  Systolic 865 784 696  Diastolic 76 73 72  Pulse 51 51 49     Physical Exam  Gen: Alert, well  appearing.  Patient is oriented to person, place, time, and situation. AFFECT: pleasant, lucid thought and speech. Oral: Inside the lower lip just to the left of midline there is a shallow focal ulceration about 2 mm in size with a bit of focal induration.  Remainder of the lips are normal. Gingiva and tongue and palate and pharynx appear normal.  LABS:  none  IMPRESSION AND PLAN:  Aphthous ulcer. Reassured.  An After Visit Summary was printed and given to the patient.  FOLLOW UP: Return if symptoms worsen or fail to improve.  Signed:  Crissie Sickles, MD           05/02/2022

## 2023-01-05 ENCOUNTER — Encounter: Payer: No Typology Code available for payment source | Admitting: Family Medicine

## 2023-01-16 NOTE — Patient Instructions (Incomplete)

## 2023-01-19 ENCOUNTER — Ambulatory Visit (INDEPENDENT_AMBULATORY_CARE_PROVIDER_SITE_OTHER): Payer: No Typology Code available for payment source | Admitting: Family Medicine

## 2023-01-19 ENCOUNTER — Encounter: Payer: Self-pay | Admitting: Family Medicine

## 2023-01-19 VITALS — BP 145/85 | HR 53 | Temp 98.2°F | Ht 71.0 in | Wt 196.2 lb

## 2023-01-19 DIAGNOSIS — Z0000 Encounter for general adult medical examination without abnormal findings: Secondary | ICD-10-CM

## 2023-01-19 DIAGNOSIS — Z23 Encounter for immunization: Secondary | ICD-10-CM | POA: Diagnosis not present

## 2023-01-19 DIAGNOSIS — Z125 Encounter for screening for malignant neoplasm of prostate: Secondary | ICD-10-CM | POA: Diagnosis not present

## 2023-01-19 DIAGNOSIS — I10 Essential (primary) hypertension: Secondary | ICD-10-CM | POA: Diagnosis not present

## 2023-01-19 DIAGNOSIS — Z1211 Encounter for screening for malignant neoplasm of colon: Secondary | ICD-10-CM

## 2023-01-19 MED ORDER — ATENOLOL 100 MG PO TABS: 100.0000 mg | ORAL_TABLET | Freq: Every day | ORAL | 3 refills | Status: DC

## 2023-01-19 NOTE — Addendum Note (Signed)
Addended by: Arty Baumgartner A on: 01/19/2023 02:44 PM   Modules accepted: Orders

## 2023-01-19 NOTE — Progress Notes (Signed)
Office Note 01/19/2023  CC:  Chief Complaint  Patient presents with   Annual Exam    HPI:  Patient is a 47 y.o. male who is here for annual health maintenance exam and follow-up hypertension. Mark Montgomery feels well.  Home blood pressure 130/80 this morning.  He just recently went for a week long camping trip in [place] to go kayaking and fishing.  Past Medical History:  Diagnosis Date   Allergic rhinitis    Capsulitis of toe of left foot 2023   Left second MTP--> Steroid injection by podiatry   Hypertension    Laryngopharyngeal reflux (LPR) 2020   Sore throat: GI and ENT eval reassuring.    Past Surgical History:  Procedure Laterality Date   flexible laryngoscopy  09/27/2018   normal   TOE SURGERY Right 2006   Great Toe    Family History  Problem Relation Age of Onset   Hearing loss Mother    Heart disease Mother    Hypertension Mother    Heart attack Father    Heart disease Sister    Heart disease Paternal Grandmother    Hypertension Paternal Grandmother    Colon cancer Neg Hx    Esophageal cancer Neg Hx    Rectal cancer Neg Hx     Social History   Socioeconomic History   Marital status: Divorced    Spouse name: Not on file   Number of children: 0   Years of education: Not on file   Highest education level: Not on file  Occupational History   Occupation: Psychologist, occupational  Tobacco Use   Smoking status: Never    Passive exposure: Yes   Smokeless tobacco: Never  Vaping Use   Vaping Use: Never used  Substance and Sexual Activity   Alcohol use: Yes    Comment: occasionally   Drug use: Never   Sexual activity: Not on file  Other Topics Concern   Not on file  Social History Narrative   Divorced,no children.   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   No tobacco.   Alc: occ/social.   Social Determinants of Health   Financial Resource Strain: Not on file  Food Insecurity: Not on file  Transportation  Needs: Not on file  Physical Activity: Not on file  Stress: Not on file  Social Connections: Not on file  Intimate Partner Violence: Not on file    Outpatient Medications Prior to Visit  Medication Sig Dispense Refill   ibuprofen (ADVIL,MOTRIN) 400 MG tablet Take 1 tablet by mouth 2 (two) times daily as needed.     atenolol (TENORMIN) 100 MG tablet Take 1 tablet (100 mg total) by mouth daily. 90 tablet 3   fluticasone (FLONASE) 50 MCG/ACT nasal spray Place 2 sprays into both nostrils daily as needed. (Patient not taking: Reported on 01/01/2021) 16 g 6   loratadine (CLARITIN) 10 MG tablet Take 1 tablet (10 mg total) by mouth daily as needed. (Patient not taking: Reported on 06/06/2020) 30 tablet 6   No facility-administered medications prior to visit.    No Known Allergies  Review of Systems  Constitutional:  Negative for appetite change, chills, fatigue and fever.  HENT:  Negative for congestion, dental problem, ear pain and sore throat.   Eyes:  Negative for discharge, redness and visual disturbance.  Respiratory:  Negative for cough, chest tightness, shortness of breath and wheezing.   Cardiovascular:  Negative for chest pain, palpitations and leg swelling.  Gastrointestinal:  Negative for abdominal pain, blood in stool, diarrhea, nausea and vomiting.  Genitourinary:  Negative for difficulty urinating, dysuria, flank pain, frequency, hematuria and urgency.  Musculoskeletal:  Negative for arthralgias, back pain, joint swelling, myalgias and neck stiffness.  Skin:  Negative for pallor and rash.  Neurological:  Negative for dizziness, speech difficulty, weakness and headaches.  Hematological:  Negative for adenopathy. Does not bruise/bleed easily.  Psychiatric/Behavioral:  Negative for confusion and sleep disturbance. The patient is not nervous/anxious.     PE;    01/19/2023    2:02 PM 08/25/2022   11:40 AM 05/02/2022   10:51 AM  Vitals with BMI  Height 5\' 11"   5\' 11"   Weight 196  lbs 3 oz  192 lbs 3 oz  BMI 27.38  26.82  Systolic 145 148 564  Diastolic 85 76 76  Pulse 53  51  Initial bp today 145/85 P 53  Rpt 140/70 manual  Gen: Alert, well appearing.  Patient is oriented to person, place, time, and situation. AFFECT: pleasant, lucid thought and speech. ENT: Ears: EACs clear, normal epithelium.  TMs with good light reflex and landmarks bilaterally.  Eyes: no injection, icteris, swelling, or exudate.  EOMI, PERRLA. Nose: no drainage or turbinate edema/swelling.  No injection or focal lesion.  Mouth: lips without lesion/swelling.  Oral mucosa pink and moist.  Dentition intact and without obvious caries or gingival swelling.  Oropharynx without erythema, exudate, or swelling.  Neck: supple/nontender.  No LAD, mass, or TM.  Carotid pulses 2+ bilaterally, without bruits. CV: RRR, no m/r/g.   LUNGS: CTA bilat, nonlabored resps, good aeration in all lung fields. ABD: soft, NT, ND, BS normal.  No hepatospenomegaly or mass.  No bruits. EXT: no clubbing, cyanosis, or edema.  Musculoskeletal: no joint swelling, erythema, warmth, or tenderness.  ROM of all joints intact. Skin - no sores or suspicious lesions or rashes or color changes  Pertinent labs:  Lab Results  Component Value Date   TSH 1.89 01/02/2022   Lab Results  Component Value Date   WBC 8.1 01/02/2022   HGB 15.7 01/02/2022   HCT 47.8 01/02/2022   MCV 89.3 01/02/2022   PLT 289.0 01/02/2022   Lab Results  Component Value Date   CREATININE 0.96 01/02/2022   BUN 12 01/02/2022   NA 141 01/02/2022   K 4.3 01/02/2022   CL 105 01/02/2022   CO2 27 01/02/2022   Lab Results  Component Value Date   ALT 19 01/02/2022   AST 15 01/02/2022   ALKPHOS 86 01/02/2022   BILITOT 0.6 01/02/2022   Lab Results  Component Value Date   CHOL 215 (H) 01/02/2022   Lab Results  Component Value Date   HDL 55.10 01/02/2022   Lab Results  Component Value Date   LDLCALC 135 (H) 01/02/2022   Lab Results  Component  Value Date   TRIG 126.0 01/02/2022   Lab Results  Component Value Date   CHOLHDL 4 01/02/2022   Lab Results  Component Value Date   PSA 0.7 03/16/2019   PSA 0.7 12/10/2017   Lab Results  Component Value Date   HGBA1C 5.6 06/06/2020   ASSESSMENT AND PLAN:   No problem-specific Assessment & Plan notes found for this encounter.  1) Health maintenance exam: Reviewed age and gender appropriate health maintenance issues (prudent diet, regular exercise, health risks of tobacco and excessive alcohol, use of seatbelts, fire alarms in home, use of sunscreen).  Also reviewed age and gender appropriate health screening  as well as vaccine recommendations. Vaccines: Tdap today. Labs: fasting HP + PSA today Prostate ca screening: PSA today Colon ca screening: due for initial screening.  Options discussed. These were written out for patient so he could check with his insurer what would be covered.  #2 hypertension, well-controlled on atenolol 100 mg a day. He does have a significant whitecoat component.  An After Visit Summary was printed and given to the patient.  FOLLOW UP:  No follow-ups on file.  Signed:  Santiago Bumpers, MD           01/19/2023

## 2023-01-20 ENCOUNTER — Other Ambulatory Visit: Payer: Self-pay

## 2023-01-20 ENCOUNTER — Telehealth: Payer: Self-pay | Admitting: Family Medicine

## 2023-01-20 LAB — COMPREHENSIVE METABOLIC PANEL
ALT: 19 U/L (ref 0–53)
AST: 15 U/L (ref 0–37)
Albumin: 4.6 g/dL (ref 3.5–5.2)
Alkaline Phosphatase: 75 U/L (ref 39–117)
BUN: 15 mg/dL (ref 6–23)
CO2: 26 mEq/L (ref 19–32)
Calcium: 9.9 mg/dL (ref 8.4–10.5)
Chloride: 104 mEq/L (ref 96–112)
Creatinine, Ser: 0.96 mg/dL (ref 0.40–1.50)
GFR: 95.56 mL/min (ref >60.00)
Glucose, Bld: 82 mg/dL (ref 70–99)
Potassium: 4 mEq/L (ref 3.5–5.1)
Sodium: 140 mEq/L (ref 135–145)
Total Bilirubin: 0.9 mg/dL (ref 0.2–1.2)
Total Protein: 7.2 g/dL (ref 6.0–8.3)

## 2023-01-20 LAB — LIPID PANEL
Cholesterol: 198 mg/dL (ref 0–200)
HDL: 46.5 mg/dL (ref >39.00)
LDL Cholesterol: 118 mg/dL — ABNORMAL HIGH (ref 0–99)
NonHDL: 151.39
Total CHOL/HDL Ratio: 4
Triglycerides: 166 mg/dL — ABNORMAL HIGH (ref 0.0–149.0)
VLDL: 33.2 mg/dL (ref 0.0–40.0)

## 2023-01-20 LAB — CBC
HCT: 45.6 % (ref 39.0–52.0)
Hemoglobin: 15 g/dL (ref 13.0–17.0)
MCHC: 32.9 g/dL (ref 30.0–36.0)
MCV: 89.3 fl (ref 78.0–100.0)
Platelets: 285 10*3/uL (ref 150.0–400.0)
RBC: 5.1 Mil/uL (ref 4.22–5.81)
RDW: 14.3 % (ref 11.5–15.5)
WBC: 8.3 10*3/uL (ref 4.0–10.5)

## 2023-01-20 LAB — TSH: TSH: 2.16 u[IU]/mL (ref 0.35–5.50)

## 2023-01-20 LAB — PSA: PSA: 0.69 ng/mL (ref 0.10–4.00)

## 2023-01-20 MED ORDER — IBUPROFEN 400 MG PO TABS: 400.0000 mg | ORAL_TABLET | Freq: Two times a day (BID) | ORAL | 3 refills | Status: DC | PRN

## 2023-01-20 NOTE — Telephone Encounter (Signed)
Refill sent.

## 2023-01-20 NOTE — Telephone Encounter (Signed)
Prescription Request  01/20/2023  LOV: 01/19/2023  What is the name of the medication or equipment? ibuprofen (ADVIL,MOTRIN) 400 MG tablet   Have you contacted your pharmacy to request a refill? Yes   Which pharmacy would you like this sent to?  [facility] [place], [place] - [address] [place] [address] Phone: [phone] Fax: [phone]    Patient notified that their request is being sent to the clinical staff for review and that they should receive a response within 2 business days.   Please advise at Mobile [phone] (mobile)

## 2023-02-03 ENCOUNTER — Telehealth: Payer: Self-pay

## 2023-02-03 NOTE — Telephone Encounter (Signed)
LVM for pt to return call regarding colon cancer screening.  Note: during pt's last OV 6/24, pt was given options on AVS. Please confirm which one pt would like to do at this time.   - Colonoscopy (referral to GI), may be recommended for repeat every 3-5, 5-7 or 10 years depending on your results - Cologuard kit (sent to your home address); repeated every 3 years unless positive then referral to GI  - iFOB kit (given or picked up from your PCP office; repeat every year   \

## 2024-01-07 ENCOUNTER — Encounter: Payer: Self-pay | Admitting: Family Medicine

## 2024-01-07 ENCOUNTER — Ambulatory Visit: Admitting: Family Medicine

## 2024-01-07 VITALS — BP 138/82 | HR 56 | Temp 98.1°F | Wt 194.0 lb

## 2024-01-07 DIAGNOSIS — H6690 Otitis media, unspecified, unspecified ear: Secondary | ICD-10-CM

## 2024-01-07 DIAGNOSIS — H9202 Otalgia, left ear: Secondary | ICD-10-CM

## 2024-01-07 MED ORDER — AMOXICILLIN-POT CLAVULANATE 875-125 MG PO TABS: 1.0000 | ORAL_TABLET | Freq: Two times a day (BID) | ORAL | 0 refills | Status: DC

## 2024-01-07 NOTE — Patient Instructions (Addendum)

## 2024-01-07 NOTE — Progress Notes (Signed)
 Mark Montgomery , 1975/11/03, 48 y.o., male MRN: [id number] Patient Care Team    Relationship Specialty Notifications Start End  McGowen, Minetta Aly, MD PCP - General Family Medicine  02/11/22   Virgina Grills, MD Consulting Physician Otolaryngology  10/10/18   Albertina Hugger, MD Consulting Physician Gastroenterology  10/10/18     Chief Complaint  Patient presents with   Ear Pain    L ear since yesterday. Pt has been experiencing congestion over the past week. Pt has taken Tylenol.      Subjective: Mark Montgomery is a 48 y.o. Pt presents for an OV with complaints of ear pain of left ear pain of 1 day duration.  Associated symptoms include nasal congestion and sore throat. He states he started to not feel well Sunday and noted a Sore throat, but has improved. He does have some horseness remaining.  Describes muffled hearing, mixture of sharp pain and constant pain of left ear that started yesterday.  Pt has tried mucinex, nyquil to ease their symptoms.      01/19/2023    2:03 PM 01/02/2022    8:04 AM 12/06/2020    8:12 AM 06/02/2019    3:02 PM  Depression screen PHQ 2/9  Decreased Interest 0 0 0 0  Down, Depressed, Hopeless 0 0 0 0  PHQ - 2 Score 0 0 0 0    No Known Allergies Social History   Social History Narrative   Divorced,no children.   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   No tobacco.   Alc: occ/social.   Past Medical History:  Diagnosis Date   Allergic rhinitis    Capsulitis of toe of left foot 2023   Left second MTP--> Steroid injection by podiatry   Hypertension    Laryngopharyngeal reflux (LPR) 2020   Sore throat: GI and ENT eval reassuring.   Past Surgical History:  Procedure Laterality Date   flexible laryngoscopy  09/27/2018   normal   TOE SURGERY Right 2006   Great Toe   Family History  Problem Relation Age of Onset   Hearing loss Mother    Heart disease Mother    Hypertension Mother    Heart  attack Father    Heart disease Sister    Heart disease Paternal Grandmother    Hypertension Paternal Grandmother    Colon cancer Neg Hx    Esophageal cancer Neg Hx    Rectal cancer Neg Hx    Allergies as of 01/07/2024   No Known Allergies      Medication List        Accurate as of January 07, 2024 10:52 AM. If you have any questions, ask your nurse or doctor.          amoxicillin-clavulanate 875-125 MG tablet Commonly known as: AUGMENTIN Take 1 tablet by mouth 2 (two) times daily. Started by: Mark Montgomery   atenolol  100 MG tablet Commonly known as: TENORMIN  Take 1 tablet (100 mg total) by mouth daily.   ibuprofen  400 MG tablet Commonly known as: ADVIL  Take 1 tablet (400 mg total) by mouth 2 (two) times daily as needed.        All past medical history, surgical history, allergies, family history, immunizations andmedications were updated in the EMR today and reviewed under the history and medication portions of their EMR.     Review of Systems  Constitutional:  Negative for chills, fever and malaise/fatigue.  HENT:  Positive for congestion, ear pain and sore throat.   Gastrointestinal: Negative.   Musculoskeletal: Negative.   Neurological:  Positive for headaches. Negative for dizziness.   Negative, with the exception of above mentioned in HPI   Objective:  BP 138/82   Pulse (!) 56   Temp 98.1 F (36.7 C)   Wt 194 lb (88 kg)   SpO2 97%   BMI 27.06 kg/m  Body mass index is 27.06 kg/m. Physical Exam Vitals and nursing note reviewed. Exam conducted with a chaperone present.  Constitutional:      General: He is not in acute distress.    Appearance: Normal appearance. He is not ill-appearing, toxic-appearing or diaphoretic.  HENT:     Head: Normocephalic and atraumatic.     Right Ear: Tympanic membrane, ear canal and external ear normal. There is no impacted cerumen.     Left Ear: Ear canal and external ear normal. Decreased hearing noted. A middle ear  effusion is present. There is no impacted cerumen. No mastoid tenderness. Tympanic membrane is injected and erythematous.     Nose: Congestion and rhinorrhea present.     Mouth/Throat:     Mouth: Mucous membranes are moist.     Pharynx: No oropharyngeal exudate or posterior oropharyngeal erythema.     Comments: PND  Eyes:     General: No scleral icterus.       Right eye: No discharge.        Left eye: No discharge.     Extraocular Movements: Extraocular movements intact.     Pupils: Pupils are equal, round, and reactive to light.    Cardiovascular:     Rate and Rhythm: Normal rate and regular rhythm.  Pulmonary:     Effort: Pulmonary effort is normal.     Breath sounds: Normal breath sounds.   Musculoskeletal:     Cervical back: Neck supple. No tenderness.  Lymphadenopathy:     Cervical: Cervical adenopathy present.   Skin:    General: Skin is warm and dry.     Coloration: Skin is not jaundiced or pale.     Findings: No rash.   Neurological:     Mental Status: He is alert and oriented to person, place, and time. Mental status is at baseline.   Psychiatric:        Mood and Affect: Mood normal.        Behavior: Behavior normal.        Thought Content: Thought content normal.        Judgment: Judgment normal.    No results found. No results found. No results found for this or any previous visit (from the past 24 hours).  Assessment/Plan: Mark Montgomery is a 48 y.o. male present for OV for  Acute otitis media, unspecified otitis media type (Primary)/Otalgia of left ear Rest, hydrate.  +/- flonase , mucinex (DM if cough), nettie pot or nasal saline.  Augmentin BID prescribed, take until completed.   F/U 2 weeks if not improved, sooner if worsening    Reviewed expectations re: course of current medical issues. Discussed self-management of symptoms. Outlined signs and symptoms indicating need for more acute intervention. Patient verbalized understanding and all  questions were answered. Patient received an After-Visit Summary.    No orders of the defined types were placed in this encounter.  Meds ordered this encounter  Medications   amoxicillin-clavulanate (AUGMENTIN) 875-125 MG tablet    Sig: Take 1 tablet by mouth 2 (two) times daily.  Dispense:  20 tablet    Refill:  0   Referral Orders  No referral(s) requested today     Note is dictated utilizing voice recognition software. Although note has been proof read prior to signing, occasional typographical errors still can be missed. If any questions arise, please do not hesitate to call for verification.   electronically signed by:  Napolean Backbone, DO  [facility] [place]

## 2024-01-21 ENCOUNTER — Encounter: Payer: No Typology Code available for payment source | Admitting: Family Medicine

## 2024-01-22 ENCOUNTER — Ambulatory Visit: Admitting: Family Medicine

## 2024-01-22 ENCOUNTER — Encounter: Payer: Self-pay | Admitting: Family Medicine

## 2024-01-22 VITALS — BP 151/83 | HR 48 | Temp 98.0°F | Wt 191.4 lb

## 2024-01-22 DIAGNOSIS — H6690 Otitis media, unspecified, unspecified ear: Secondary | ICD-10-CM

## 2024-01-22 DIAGNOSIS — Z0001 Encounter for general adult medical examination with abnormal findings: Secondary | ICD-10-CM

## 2024-01-22 DIAGNOSIS — Z86005 Personal history of in-situ neoplasm of middle ear and respiratory system: Secondary | ICD-10-CM

## 2024-01-22 DIAGNOSIS — H6992 Unspecified Eustachian tube disorder, left ear: Secondary | ICD-10-CM | POA: Diagnosis not present

## 2024-01-22 DIAGNOSIS — Z125 Encounter for screening for malignant neoplasm of prostate: Secondary | ICD-10-CM | POA: Diagnosis not present

## 2024-01-22 DIAGNOSIS — I10 Essential (primary) hypertension: Secondary | ICD-10-CM

## 2024-01-22 DIAGNOSIS — Z0000 Encounter for general adult medical examination without abnormal findings: Secondary | ICD-10-CM

## 2024-01-22 LAB — CBC WITH DIFFERENTIAL/PLATELET
Basophils Absolute: 0.2 10*3/uL — ABNORMAL HIGH (ref 0.0–0.1)
Basophils Relative: 2 % (ref 0.0–3.0)
Eosinophils Absolute: 0.2 10*3/uL (ref 0.0–0.7)
Eosinophils Relative: 2.1 % (ref 0.0–5.0)
HCT: 46.7 % (ref 39.0–52.0)
Hemoglobin: 15.5 g/dL (ref 13.0–17.0)
Lymphocytes Relative: 20.9 % (ref 12.0–46.0)
Lymphs Abs: 1.7 10*3/uL (ref 0.7–4.0)
MCHC: 33.2 g/dL (ref 30.0–36.0)
MCV: 87.6 fl (ref 78.0–100.0)
Monocytes Absolute: 0.6 10*3/uL (ref 0.1–1.0)
Monocytes Relative: 6.6 % (ref 3.0–12.0)
Neutro Abs: 5.7 10*3/uL (ref 1.4–7.7)
Neutrophils Relative %: 68.4 % (ref 43.0–77.0)
Platelets: 392 10*3/uL (ref 150.0–400.0)
RBC: 5.33 Mil/uL (ref 4.22–5.81)
RDW: 13.7 % (ref 11.5–15.5)
WBC: 8.4 10*3/uL (ref 4.0–10.5)

## 2024-01-22 LAB — COMPREHENSIVE METABOLIC PANEL WITH GFR
ALT: 21 U/L (ref 0–53)
AST: 15 U/L (ref 0–37)
Albumin: 4.7 g/dL (ref 3.5–5.2)
Alkaline Phosphatase: 91 U/L (ref 39–117)
BUN: 13 mg/dL (ref 6–23)
CO2: 30 meq/L (ref 19–32)
Calcium: 9.6 mg/dL (ref 8.4–10.5)
Chloride: 104 meq/L (ref 96–112)
Creatinine, Ser: 0.96 mg/dL (ref 0.40–1.50)
GFR: 94.89 mL/min (ref >60.00)
Glucose, Bld: 104 mg/dL — ABNORMAL HIGH (ref 70–99)
Potassium: 5 meq/L (ref 3.5–5.1)
Sodium: 140 meq/L (ref 135–145)
Total Bilirubin: 0.5 mg/dL (ref 0.2–1.2)
Total Protein: 7.4 g/dL (ref 6.0–8.3)

## 2024-01-22 LAB — LIPID PANEL
Cholesterol: 225 mg/dL — ABNORMAL HIGH (ref 0–200)
HDL: 52.5 mg/dL (ref >39.00)
LDL Cholesterol: 148 mg/dL — ABNORMAL HIGH (ref 0–99)
NonHDL: 172.42
Total CHOL/HDL Ratio: 4
Triglycerides: 122 mg/dL (ref 0.0–149.0)
VLDL: 24.4 mg/dL (ref 0.0–40.0)

## 2024-01-22 LAB — TSH: TSH: 1.93 u[IU]/mL (ref 0.35–5.50)

## 2024-01-22 LAB — PSA: PSA: 0.89 ng/mL (ref 0.10–4.00)

## 2024-01-22 MED ORDER — ATENOLOL 100 MG PO TABS: 100.0000 mg | ORAL_TABLET | Freq: Every day | ORAL | 1 refills | Status: DC

## 2024-01-22 NOTE — Progress Notes (Signed)
 Office Note 01/22/2024  CC:  Chief Complaint  Patient presents with   Follow-up    2 week follow up. Pt states his left ear still feels full and he still has a slight cough.    HPI:  Patient is a 48 y.o. male who is here for annual health maintenance exam and follow-up hypertension as well as recent left otitis media--> treated with Augmentin  on 01/07/2024.  Still little bit of residual nasal mucus, postnasal drip, and slight cough.  Overall, though he feels much improved.  He has some left ear fullness/muffling but no pain. No fever.  He is currently using saline nasal spray.  He finished his antibiotic.  Home blood pressures are consistently less than 130/80  Past Medical History:  Diagnosis Date   Allergic rhinitis    Capsulitis of toe of left foot 2023   Left second MTP--> Steroid injection by podiatry   Hypertension    Laryngopharyngeal reflux (LPR) 2020   Sore throat: GI and ENT eval reassuring.    Past Surgical History:  Procedure Laterality Date   flexible laryngoscopy  09/27/2018   normal   TOE SURGERY Right 2006   Great Toe    Family History  Problem Relation Age of Onset   Hearing loss Mother    Heart disease Mother    Hypertension Mother    Heart attack Father    Heart disease Sister    Heart disease Paternal Grandmother    Hypertension Paternal Grandmother    Colon cancer Neg Hx    Esophageal cancer Neg Hx    Rectal cancer Neg Hx     Social History   Socioeconomic History   Marital status: Divorced    Spouse name: Not on file   Number of children: 0   Years of education: Not on file   Highest education level: Not on file  Occupational History   Occupation: Psychologist, occupational  Tobacco Use   Smoking status: Never    Passive exposure: Yes   Smokeless tobacco: Never  Vaping Use   Vaping status: Never Used  Substance and Sexual Activity   Alcohol use: Yes    Comment: occasionally   Drug use: Never   Sexual activity: Not on file   Other Topics Concern   Not on file  Social History Narrative   Divorced,no children.   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   No tobacco.   Alc: occ/social.   Social Drivers of Corporate investment banker Strain: Not on file  Food Insecurity: Not on file  Transportation Needs: Not on file  Physical Activity: Not on file  Stress: Not on file  Social Connections: Not on file  Intimate Partner Violence: Not on file    Outpatient Medications Prior to Visit  Medication Sig Dispense Refill   amoxicillin -clavulanate (AUGMENTIN ) 875-125 MG tablet Take 1 tablet by mouth 2 (two) times daily. (Patient not taking: Reported on 01/22/2024) 20 tablet 0   atenolol  (TENORMIN ) 100 MG tablet Take 1 tablet (100 mg total) by mouth daily. 90 tablet 3   ibuprofen  (ADVIL ) 400 MG tablet Take 1 tablet (400 mg total) by mouth 2 (two) times daily as needed. 30 tablet 3   No facility-administered medications prior to visit.    No Known Allergies  Review of Systems  Constitutional:  Negative for appetite change, chills, fatigue and fever.  HENT:  Negative for congestion, dental problem, ear pain and sore throat.   Eyes:  Negative for discharge, redness and visual disturbance.  Respiratory:  Negative for cough, chest tightness, shortness of breath and wheezing.   Cardiovascular:  Negative for chest pain, palpitations and leg swelling.  Gastrointestinal:  Negative for abdominal pain, blood in stool, diarrhea, nausea and vomiting.  Genitourinary:  Negative for difficulty urinating, dysuria, flank pain, frequency, hematuria and urgency.  Musculoskeletal:  Negative for arthralgias, back pain, joint swelling, myalgias and neck stiffness.  Skin:  Negative for pallor and rash.  Neurological:  Negative for dizziness, speech difficulty, weakness and headaches.  Hematological:  Negative for adenopathy. Does not bruise/bleed easily.  Psychiatric/Behavioral:  Negative  for confusion and sleep disturbance. The patient is not nervous/anxious.     PE;    01/22/2024    9:39 AM 01/22/2024    9:28 AM 01/07/2024   10:41 AM  Vitals with BMI  Weight  191 lbs 6 oz   Systolic 151 162 861  Diastolic 83 94 82  Pulse  48    Gen: Alert, well appearing.  Patient is oriented to person, place, time, and situation. AFFECT: pleasant, lucid thought and speech. ENT: Ears: EACs clear, normal epithelium.  TMs with good light reflex and landmarks bilaterally.  Eyes: no injection, icteris, swelling, or exudate.  EOMI, PERRLA. Nose: no drainage or turbinate edema/swelling.  No injection or focal lesion.  Mouth: lips without lesion/swelling.  Oral mucosa pink and moist.  Dentition intact and without obvious caries or gingival swelling.  Oropharynx without erythema, exudate, or swelling.  Neck: supple/nontender.  No LAD, mass, or TM.  Carotid pulses 2+ bilaterally, without bruits. CV: RRR, no m/r/g.   LUNGS: CTA bilat, nonlabored resps, good aeration in all lung fields. ABD: soft, NT, ND, BS normal.  No hepatospenomegaly or mass.  No bruits. EXT: no clubbing, cyanosis, or edema.  Musculoskeletal: no joint swelling, erythema, warmth, or tenderness.  ROM of all joints intact. Skin - no sores or suspicious lesions or rashes or color changes  Pertinent labs:  Lab Results  Component Value Date   TSH 2.16 01/19/2023   Lab Results  Component Value Date   WBC 8.3 01/19/2023   HGB 15.0 01/19/2023   HCT 45.6 01/19/2023   MCV 89.3 01/19/2023   PLT 285.0 01/19/2023   Lab Results  Component Value Date   CREATININE 0.96 01/19/2023   BUN 15 01/19/2023   NA 140 01/19/2023   K 4.0 01/19/2023   CL 104 01/19/2023   CO2 26 01/19/2023   Lab Results  Component Value Date   ALT 19 01/19/2023   AST 15 01/19/2023   ALKPHOS 75 01/19/2023   BILITOT 0.9 01/19/2023   Lab Results  Component Value Date   CHOL 198 01/19/2023   Lab Results  Component Value Date   HDL 46.50 01/19/2023    Lab Results  Component Value Date   LDLCALC 118 (H) 01/19/2023   Lab Results  Component Value Date   TRIG 166.0 (H) 01/19/2023   Lab Results  Component Value Date   CHOLHDL 4 01/19/2023   Lab Results  Component Value Date   PSA 0.69 01/19/2023   PSA 0.7 03/16/2019   PSA 0.7 12/10/2017   Lab Results  Component Value Date   HGBA1C 5.6 06/06/2020   ASSESSMENT AND PLAN:   1) Health maintenance exam: Reviewed age and gender appropriate health maintenance issues (prudent diet, regular exercise, health risks of tobacco and excessive alcohol, use of seatbelts, fire alarms in home, use of sunscreen).  Also reviewed age  and gender appropriate health screening as well as vaccine recommendations. Vaccines: UTD Labs: fasting HP + PSA today Prostate ca screening: PSA today Colon ca screening: due for initial screening.  Options discussed. These were written out for patient so he could check with his insurer what would be covered.  #2 left acute otitis media, resolved.  He does have a bit of eustachian tube dysfunction that is slowly resolving.  Encouraged ongoing use of saline nasal spray. Signs/symptoms to call or return for were reviewed and pt expressed understanding.  #3 essential hypertension, well-controlled on atenolol  100 mg daily.  An After Visit Summary was printed and given to the patient.  FOLLOW UP:  Return in about 6 months (around 07/23/2024) for routine chronic illness f/u.  Signed:  Gerlene Hockey, MD           01/22/2024

## 2024-01-22 NOTE — Patient Instructions (Signed)
 Medical guidelines state that colon cancer screening should start at age 46.  Either a colonoscopy or a stool test can be done for this screening.   Health Maintenance, Male Adopting a healthy lifestyle and getting preventive care are important in promoting health and wellness. Ask your health care provider about: The right schedule for you to have regular tests and exams. Things you can do on your own to prevent diseases and keep yourself healthy. What should I know about diet, weight, and exercise? Eat a healthy diet  Eat a diet that includes plenty of vegetables, fruits, low-fat dairy products, and lean protein. Do not eat a lot of foods that are high in solid fats, added sugars, or sodium. Maintain a healthy weight Body mass index (BMI) is a measurement that can be used to identify possible weight problems. It estimates body fat based on height and weight. Your health care provider can help determine your BMI and help you achieve or maintain a healthy weight. Get regular exercise Get regular exercise. This is one of the most important things you can do for your health. Most adults should: Exercise for at least 150 minutes each week. The exercise should increase your heart rate and make you sweat (moderate-intensity exercise). Do strengthening exercises at least twice a week. This is in addition to the moderate-intensity exercise. Spend less time sitting. Even light physical activity can be beneficial. Watch cholesterol and blood lipids Have your blood tested for lipids and cholesterol at 46 years of age, then have this test every 5 years. You may need to have your cholesterol levels checked more often if: Your lipid or cholesterol levels are high. You are older than 46 years of age. You are at high risk for heart disease. What should I know about cancer screening? Many types of cancers can be detected early and may often be prevented. Depending on your health history and family history,  you may need to have cancer screening at various ages. This may include screening for: Colorectal cancer. Prostate cancer. Skin cancer. Lung cancer. What should I know about heart disease, diabetes, and high blood pressure? Blood pressure and heart disease High blood pressure causes heart disease and increases the risk of stroke. This is more likely to develop in people who have high blood pressure readings or are overweight. Talk with your health care provider about your target blood pressure readings. Have your blood pressure checked: Every 3-5 years if you are 76-71 years of age. Every year if you are 19 years old or older. If you are between the ages of 58 and 51 and are a current or former smoker, ask your health care provider if you should have a one-time screening for abdominal aortic aneurysm (AAA). Diabetes Have regular diabetes screenings. This checks your fasting blood sugar level. Have the screening done: Once every three years after age 70 if you are at a normal weight and have a low risk for diabetes. More often and at a younger age if you are overweight or have a high risk for diabetes. What should I know about preventing infection? Hepatitis B If you have a higher risk for hepatitis B, you should be screened for this virus. Talk with your health care provider to find out if you are at risk for hepatitis B infection. Hepatitis C Blood testing is recommended for: Everyone born from [birth year] through 1963. Anyone with known risk factors for hepatitis C. Sexually transmitted infections (STIs) You should be screened each year for  STIs, including gonorrhea and chlamydia, if: You are sexually active and are younger than 46 years of age. You are older than 46 years of age and your health care provider tells you that you are at risk for this type of infection. Your sexual activity has changed since you were last screened, and you are at increased risk for chlamydia or gonorrhea. Ask  your health care provider if you are at risk. Ask your health care provider about whether you are at high risk for HIV. Your health care provider may recommend a prescription medicine to help prevent HIV infection. If you choose to take medicine to prevent HIV, you should first get tested for HIV. You should then be tested every 3 months for as long as you are taking the medicine. Follow these instructions at home: Alcohol use Do not drink alcohol if your health care provider tells you not to drink. If you drink alcohol: Limit how much you have to 0-2 drinks a day. Know how much alcohol is in your drink. In the [place] one drink equals one 12 oz bottle of beer (355 mL), one 5 oz glass of wine (148 mL), or one 1 oz glass of hard liquor (44 mL). Lifestyle Do not use any products that contain nicotine or tobacco. These products include cigarettes, chewing tobacco, and vaping devices, such as e-cigarettes. If you need help quitting, ask your health care provider. Do not use street drugs. Do not share needles. Ask your health care provider for help if you need support or information about quitting drugs. General instructions Schedule regular health, dental, and eye exams. Stay current with your vaccines. Tell your health care provider if: You often feel depressed. You have ever been abused or do not feel safe at home. Summary Adopting a healthy lifestyle and getting preventive care are important in promoting health and wellness. Follow your health care provider's instructions about healthy diet, exercising, and getting tested or screened for diseases. Follow your health care provider's instructions on monitoring your cholesterol and blood pressure. This information is not intended to replace advice given to you by your health care provider. Make sure you discuss any questions you have with your health care provider. Document Revised: 12/03/2020 Document Reviewed: 12/03/2020 Elsevier Patient  Education  2024 [employer].

## 2024-01-24 ENCOUNTER — Ambulatory Visit: Payer: Self-pay | Admitting: Family Medicine

## 2024-01-26 ENCOUNTER — Encounter: Admitting: Family Medicine

## 2024-01-26 NOTE — Progress Notes (Deleted)
 j

## 2024-06-10 ENCOUNTER — Other Ambulatory Visit: Payer: Self-pay | Admitting: Family Medicine

## 2024-06-13 ENCOUNTER — Other Ambulatory Visit: Payer: Self-pay | Admitting: Family Medicine

## 2024-06-15 ENCOUNTER — Other Ambulatory Visit: Payer: Self-pay | Admitting: Family Medicine

## 2024-06-17 ENCOUNTER — Other Ambulatory Visit: Payer: Self-pay | Admitting: Family Medicine

## 2024-06-17 NOTE — Telephone Encounter (Signed)
 Copied from CRM [id number]. Topic: Clinical - Prescription Issue >> Jun 17, 2024 12:04 PM Robinson H wrote: Reason for CRM: Patient following up on refill request sent over by pharmacy for the ibuprofen  (ADVIL ) 400 MG tablet, medication shows discontinued by provider and shows multiple request received. Notes on request state refill not appropriate, please reach out to patient for some clarity.  Rusty [phone]

## 2024-06-17 NOTE — Telephone Encounter (Signed)
 Pt has not had medication refilled for 1 year, pt should schedule appt if needing refill.

## 2024-06-27 ENCOUNTER — Other Ambulatory Visit: Payer: Self-pay | Admitting: Family Medicine

## 2024-07-24 NOTE — Progress Notes (Unsigned)
 OFFICE VISIT  07/25/2024  CC:  Chief Complaint  Patient presents with   Medical Management of Chronic Issues    Patient is a 48 y.o. male who presents for 6 mo f/u HTN. A/P as of last visit: Essential hypertension, well-controlled on atenolol  100 mg daily.  INTERIM HX: All labs normal last visit except LDL 148.  10 yr frmhm cv risk= 3.0%.  Feeling well. No concerns.   Past Medical History:  Diagnosis Date   Allergic rhinitis    Capsulitis of toe of left foot 2023   Left second MTP--> Steroid injection by podiatry   Hypertension    Laryngopharyngeal reflux (LPR) 2020   Sore throat: GI and ENT eval reassuring.    Past Surgical History:  Procedure Laterality Date   flexible laryngoscopy  09/27/2018   normal   TOE SURGERY Right 2006   Great Toe    Outpatient Medications Prior to Visit  Medication Sig Dispense Refill   amoxicillin -clavulanate (AUGMENTIN ) 875-125 MG tablet Take 1 tablet by mouth 2 (two) times daily. (Patient not taking: Reported on 01/22/2024) 20 tablet 0   atenolol  (TENORMIN ) 100 MG tablet Take 1 tablet (100 mg total) by mouth daily. 90 tablet 1   No facility-administered medications prior to visit.    Allergies[1]  Review of Systems As per HPI  PE:    07/25/2024    8:06 AM 01/22/2024    9:39 AM 01/22/2024    9:28 AM  Vitals with BMI  Weight 199 lbs 6 oz  191 lbs 6 oz  Systolic 132 151 837  Diastolic 75 83 94  Pulse 51  48   Physical Exam  Gen: Alert, well appearing.  Patient is oriented to person, place, time, and situation. AFFECT: pleasant, lucid thought and speech. CV: RRR, no m/r/g.   LUNGS: CTA bilat, nonlabored resps, good aeration in all lung fields. EXT: no clubbing or cyanosis.  no edema.    LABS:  Lab Results  Component Value Date   TSH 1.93 01/22/2024   Lab Results  Component Value Date   WBC 8.4 01/22/2024   HGB 15.5 01/22/2024   HCT 46.7 01/22/2024   MCV 87.6 01/22/2024   PLT 392.0 01/22/2024   Lab Results   Component Value Date   CREATININE 0.96 01/22/2024   BUN 13 01/22/2024   NA 140 01/22/2024   K 5.0 01/22/2024   CL 104 01/22/2024   CO2 30 01/22/2024   Lab Results  Component Value Date   ALT 21 01/22/2024   AST 15 01/22/2024   ALKPHOS 91 01/22/2024   BILITOT 0.5 01/22/2024   Lab Results  Component Value Date   CHOL 225 (H) 01/22/2024   Lab Results  Component Value Date   HDL 52.50 01/22/2024   Lab Results  Component Value Date   LDLCALC 148 (H) 01/22/2024   Lab Results  Component Value Date   TRIG 122.0 01/22/2024   Lab Results  Component Value Date   CHOLHDL 4 01/22/2024   Lab Results  Component Value Date   PSA 0.89 01/22/2024   PSA 0.69 01/19/2023   PSA 0.7 03/16/2019   Lab Results  Component Value Date   HGBA1C 5.6 06/06/2020   IMPRESSION AND PLAN:  1) HTN, well controlled on atenolol  100mg  every day.  2) Hyperlipidemia. LDL 148 about 6 mo ago.   10 yr frhm risk is 10%-->TLC. Rpt labs 6 mo.  An After Visit Summary was printed and given to the patient.  FOLLOW UP:  Return in about 6 months (around 01/23/2025) for annual CPE (fasting).  Signed:  Gerlene Hockey, MD           07/25/2024       [1] No Known Allergies

## 2024-07-25 ENCOUNTER — Ambulatory Visit: Admitting: Family Medicine

## 2024-07-25 ENCOUNTER — Encounter: Payer: Self-pay | Admitting: Family Medicine

## 2024-07-25 VITALS — BP 132/75 | HR 51 | Temp 97.5°F | Wt 199.4 lb

## 2024-07-25 DIAGNOSIS — I10 Essential (primary) hypertension: Secondary | ICD-10-CM

## 2024-07-25 DIAGNOSIS — Z23 Encounter for immunization: Secondary | ICD-10-CM | POA: Diagnosis not present

## 2024-07-25 MED ORDER — IBUPROFEN 400 MG PO TABS: 400.0000 mg | ORAL_TABLET | Freq: Two times a day (BID) | ORAL | 3 refills | Status: AC | PRN

## 2024-07-25 MED ORDER — ATENOLOL 100 MG PO TABS: 100.0000 mg | ORAL_TABLET | Freq: Every day | ORAL | 3 refills | Status: AC
